# Patient Record
Sex: Male | Born: 1940 | Race: White | Hispanic: No | Marital: Married | Smoking: Former smoker
Health system: Southern US, Community
[De-identification: ages and names within clinical notes are randomized; demographics above are authoritative.]

## PROBLEM LIST (undated history)

## (undated) DIAGNOSIS — E119 Type 2 diabetes mellitus without complications: Secondary | ICD-10-CM

## (undated) DIAGNOSIS — H532 Diplopia: Secondary | ICD-10-CM

## (undated) DIAGNOSIS — C801 Malignant (primary) neoplasm, unspecified: Secondary | ICD-10-CM

## (undated) DIAGNOSIS — I714 Abdominal aortic aneurysm, without rupture, unspecified: Secondary | ICD-10-CM

## (undated) DIAGNOSIS — K4090 Unilateral inguinal hernia, without obstruction or gangrene, not specified as recurrent: Secondary | ICD-10-CM

## (undated) DIAGNOSIS — H9190 Unspecified hearing loss, unspecified ear: Secondary | ICD-10-CM

## (undated) DIAGNOSIS — Z9289 Personal history of other medical treatment: Secondary | ICD-10-CM

## (undated) DIAGNOSIS — I10 Essential (primary) hypertension: Secondary | ICD-10-CM

## (undated) DIAGNOSIS — E039 Hypothyroidism, unspecified: Secondary | ICD-10-CM

## (undated) DIAGNOSIS — E785 Hyperlipidemia, unspecified: Secondary | ICD-10-CM

## (undated) DIAGNOSIS — M1990 Unspecified osteoarthritis, unspecified site: Secondary | ICD-10-CM

## (undated) DIAGNOSIS — Z9889 Other specified postprocedural states: Secondary | ICD-10-CM

## (undated) HISTORY — DX: Essential (primary) hypertension: I10

## (undated) HISTORY — DX: Unspecified osteoarthritis, unspecified site: M19.90

## (undated) HISTORY — DX: Abdominal aortic aneurysm, without rupture: I71.4

## (undated) HISTORY — DX: Hyperlipidemia, unspecified: E78.5

## (undated) HISTORY — PX: EYE SURGERY: SHX253

## (undated) HISTORY — DX: Type 2 diabetes mellitus without complications: E11.9

## (undated) HISTORY — DX: Personal history of other medical treatment: Z92.89

## (undated) HISTORY — DX: Other specified postprocedural states: Z98.890

## (undated) HISTORY — DX: Abdominal aortic aneurysm, without rupture, unspecified: I71.40

---
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
## 1958-09-11 DIAGNOSIS — Z9889 Other specified postprocedural states: Secondary | ICD-10-CM

## 1958-09-11 HISTORY — PX: HAND AMPUTATION  AT WRIST: SUR29

## 1958-09-11 HISTORY — DX: Other specified postprocedural states: Z98.890

## 1967-12-30 HISTORY — PX: OTHER SURGICAL HISTORY: SHX169

## 1967-12-30 HISTORY — PX: APPENDECTOMY: SHX54

## 1995-01-29 HISTORY — PX: OTHER SURGICAL HISTORY: SHX169

## 2002-12-29 HISTORY — PX: BACK SURGERY: SHX140

## 2003-05-24 ENCOUNTER — Encounter: Admission: RE | Admit: 2003-05-24 | Discharge: 2003-05-24 | Payer: Self-pay | Admitting: Neurosurgery

## 2003-05-24 ENCOUNTER — Encounter: Payer: Self-pay | Admitting: Neurosurgery

## 2003-05-30 HISTORY — PX: NECK SURGERY: SHX720

## 2003-06-16 ENCOUNTER — Encounter: Payer: Self-pay | Admitting: Neurosurgery

## 2003-06-20 ENCOUNTER — Inpatient Hospital Stay (HOSPITAL_COMMUNITY): Admission: RE | Admit: 2003-06-20 | Discharge: 2003-06-21 | Payer: Self-pay | Admitting: Neurosurgery

## 2003-06-20 ENCOUNTER — Encounter: Payer: Self-pay | Admitting: Neurosurgery

## 2003-07-15 ENCOUNTER — Encounter: Payer: Self-pay | Admitting: Family Medicine

## 2003-07-15 ENCOUNTER — Encounter: Admission: RE | Admit: 2003-07-15 | Discharge: 2003-07-15 | Payer: Self-pay | Admitting: Family Medicine

## 2003-08-08 ENCOUNTER — Encounter: Payer: Self-pay | Admitting: Neurosurgery

## 2003-08-08 ENCOUNTER — Encounter: Payer: Self-pay | Admitting: Diagnostic Radiology

## 2003-08-08 ENCOUNTER — Encounter: Admission: RE | Admit: 2003-08-08 | Discharge: 2003-08-08 | Payer: Self-pay | Admitting: Neurosurgery

## 2003-08-23 ENCOUNTER — Encounter: Admission: RE | Admit: 2003-08-23 | Discharge: 2003-08-23 | Payer: Self-pay | Admitting: Neurosurgery

## 2003-08-23 ENCOUNTER — Encounter: Payer: Self-pay | Admitting: Neurosurgery

## 2003-09-08 ENCOUNTER — Encounter: Payer: Self-pay | Admitting: Neurosurgery

## 2003-09-08 ENCOUNTER — Encounter: Admission: RE | Admit: 2003-09-08 | Discharge: 2003-09-08 | Payer: Self-pay | Admitting: Neurosurgery

## 2003-10-17 ENCOUNTER — Encounter: Payer: Self-pay | Admitting: Neurosurgery

## 2003-10-17 ENCOUNTER — Inpatient Hospital Stay (HOSPITAL_COMMUNITY): Admission: RE | Admit: 2003-10-17 | Discharge: 2003-10-20 | Payer: Self-pay | Admitting: Neurosurgery

## 2003-11-16 ENCOUNTER — Ambulatory Visit (HOSPITAL_COMMUNITY): Admission: RE | Admit: 2003-11-16 | Discharge: 2003-11-16 | Payer: Self-pay | Admitting: Neurosurgery

## 2003-11-24 ENCOUNTER — Inpatient Hospital Stay (HOSPITAL_COMMUNITY): Admission: EM | Admit: 2003-11-24 | Discharge: 2003-12-01 | Payer: Self-pay | Admitting: *Deleted

## 2003-11-27 ENCOUNTER — Encounter (INDEPENDENT_AMBULATORY_CARE_PROVIDER_SITE_OTHER): Payer: Self-pay | Admitting: Cardiology

## 2003-11-28 ENCOUNTER — Encounter (INDEPENDENT_AMBULATORY_CARE_PROVIDER_SITE_OTHER): Payer: Self-pay | Admitting: Specialist

## 2004-06-11 ENCOUNTER — Ambulatory Visit (HOSPITAL_COMMUNITY): Admission: RE | Admit: 2004-06-11 | Discharge: 2004-06-11 | Payer: Self-pay | Admitting: Neurosurgery

## 2004-10-15 IMAGING — CT CT CHEST W/ CM
1 series · 16 of 31 positions shown, 20 images · IV contrast ([ID] omni 300)
Comparison: none

CLINICAL DATA: Pain, weakness, swelling.  
CT CHEST WITH CONTRAST, 11/29/03
TECHNIQUE: Multidetector helical CT scanning obtained through the chest following administration of 100 cc intravenous Omnipaque 300.  No immediate adverse reaction.

[Series 2: chest w/ · axial · 0.74mm/px · z∈[-310,-30]mm · 16 of 62 slices shown, 20 images]
[im 3/62  mediastinal]
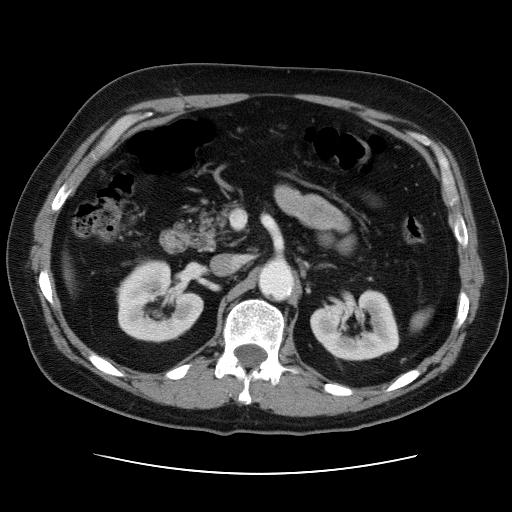
[im 3/62  lung]
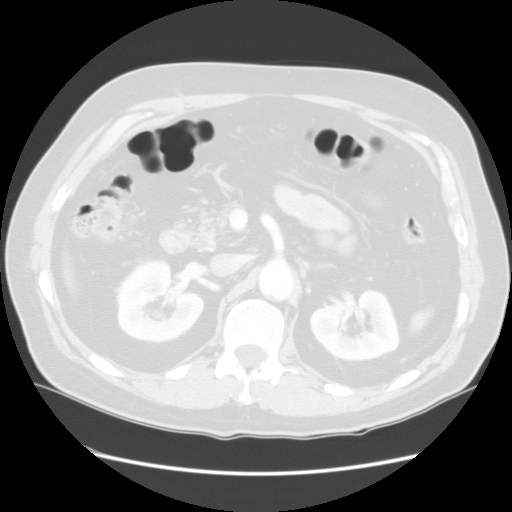
[im 7/62  lung]
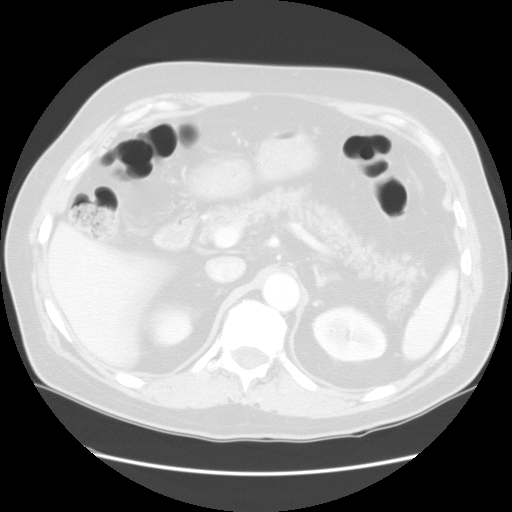
[im 12/62  lung]
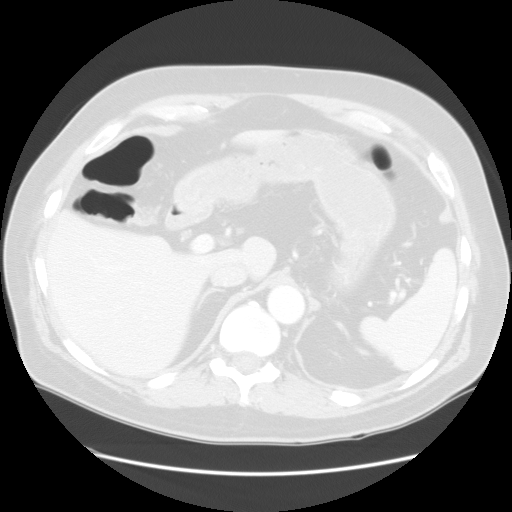
[im 14/62  lung]
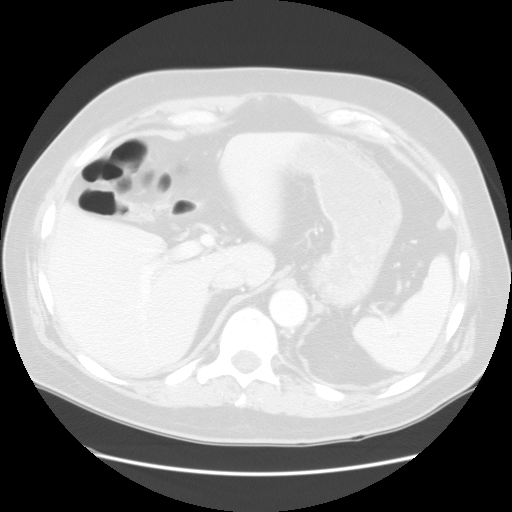
[im 19/62  mediastinal]
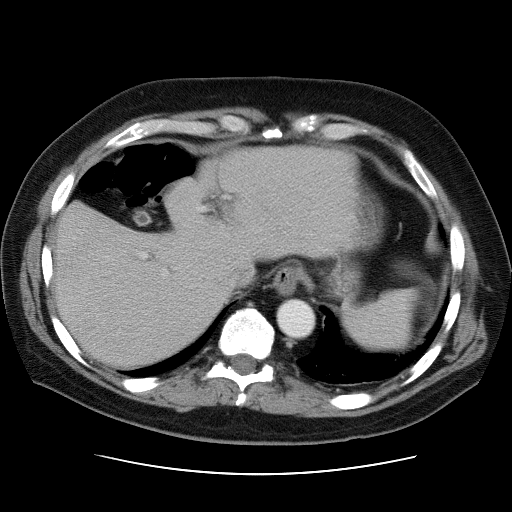
[im 19/62  lung]
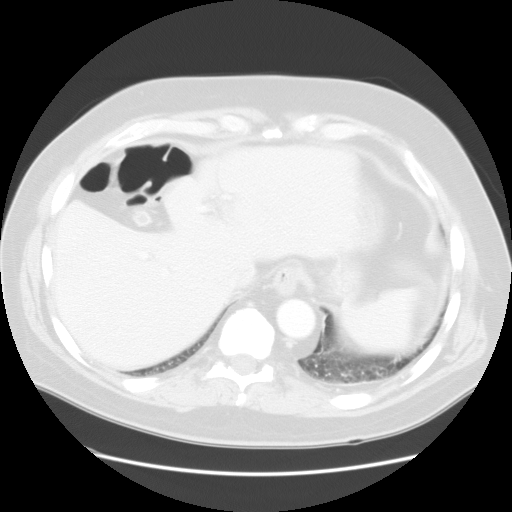
[im 21/62  lung]
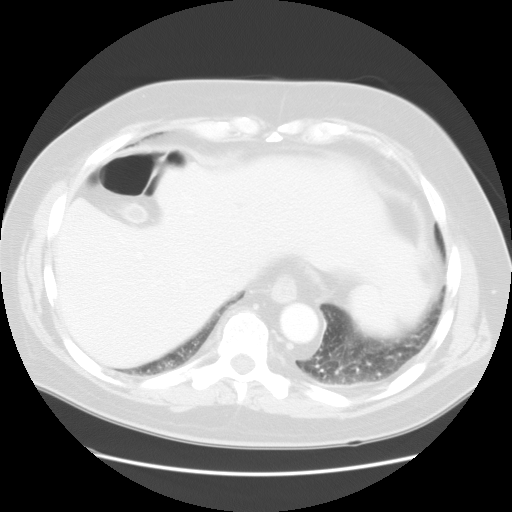
[im 25/62  lung]
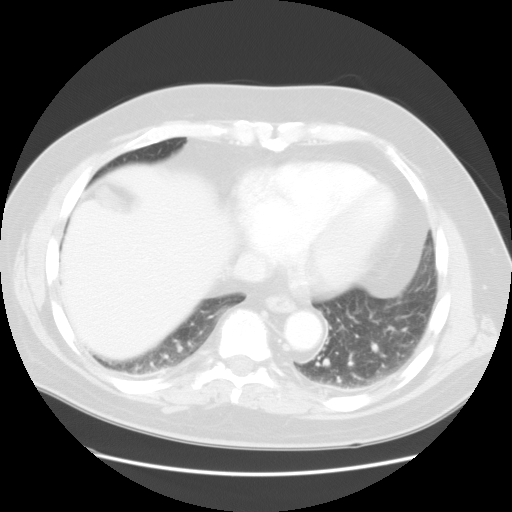
[im 30/62  lung]
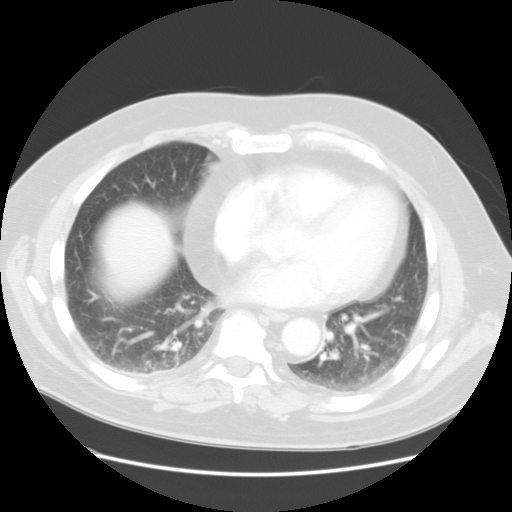
[im 33/62  mediastinal]
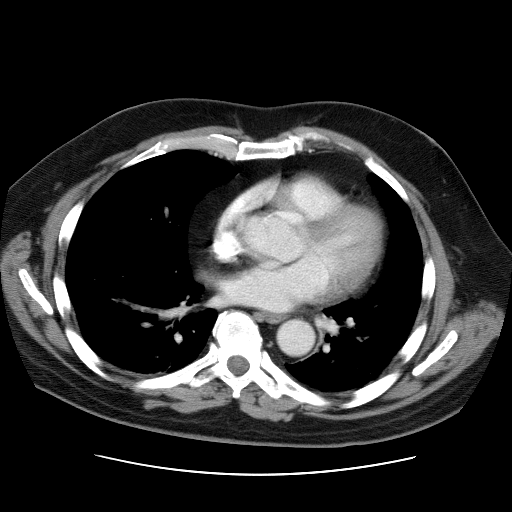
[im 33/62  lung]
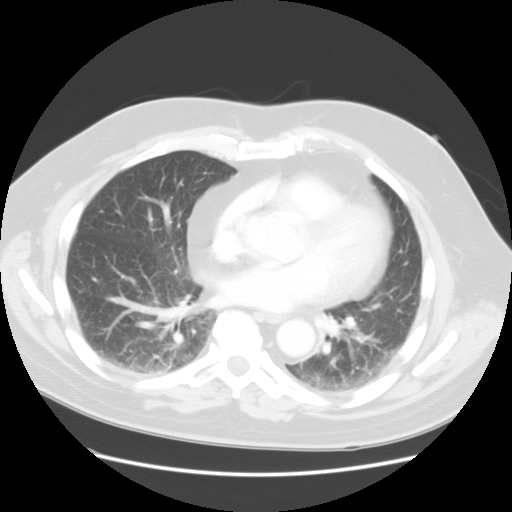
[im 37/62  lung]
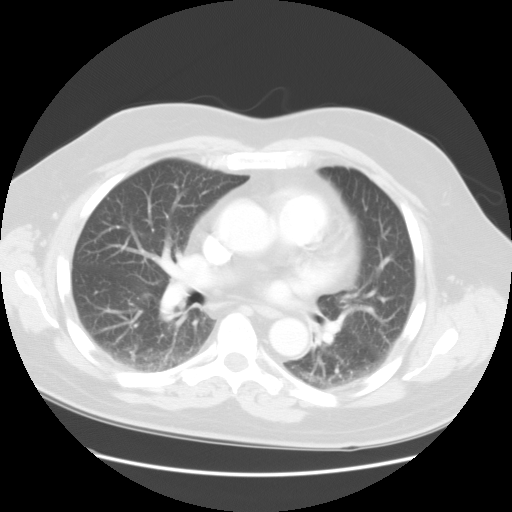
[im 41/62  lung]
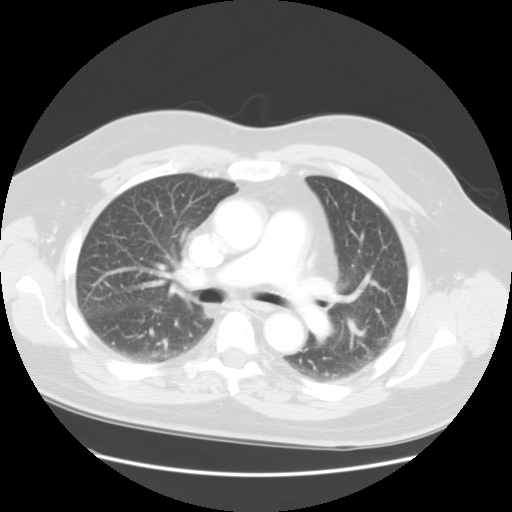
[im 43/62  lung]
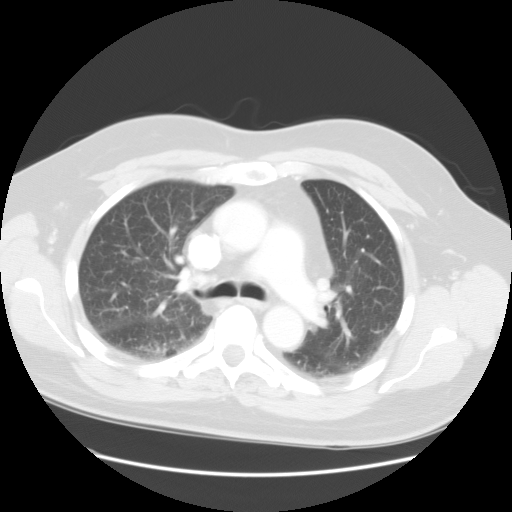
[im 48/62  mediastinal]
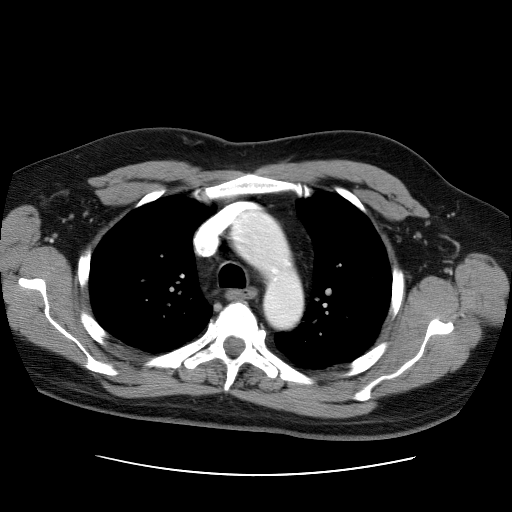
[im 48/62  lung]
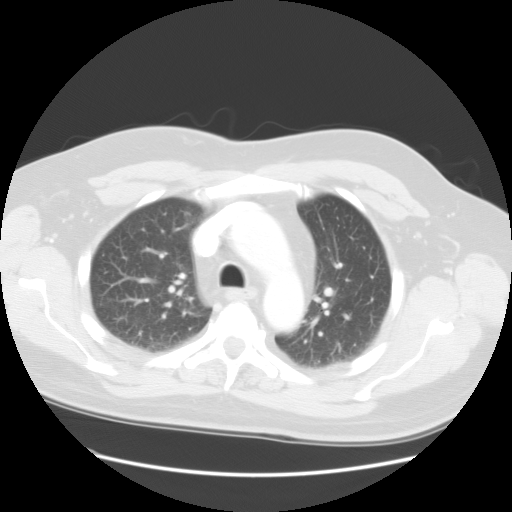
[im 50/62  lung]
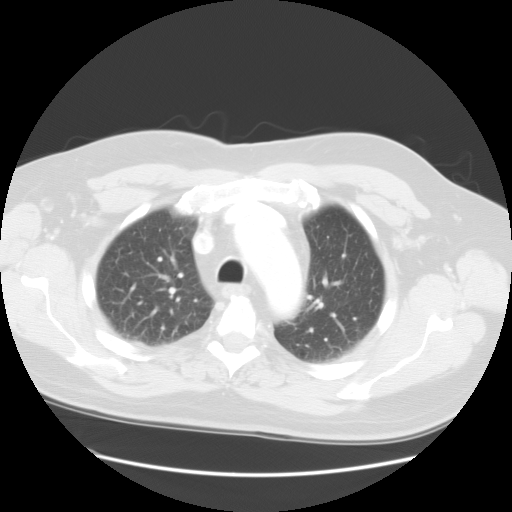
[im 55/62  lung]
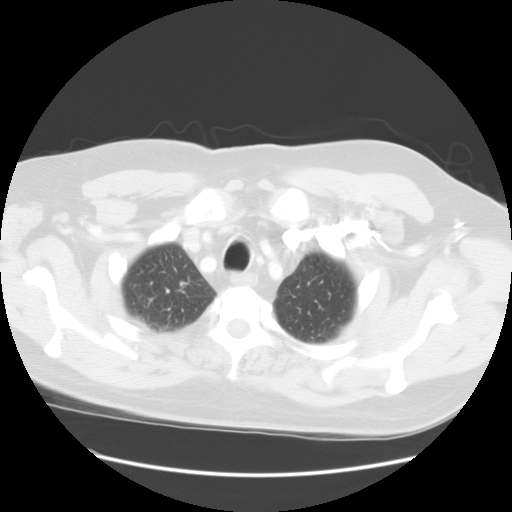
[im 59/62  lung]
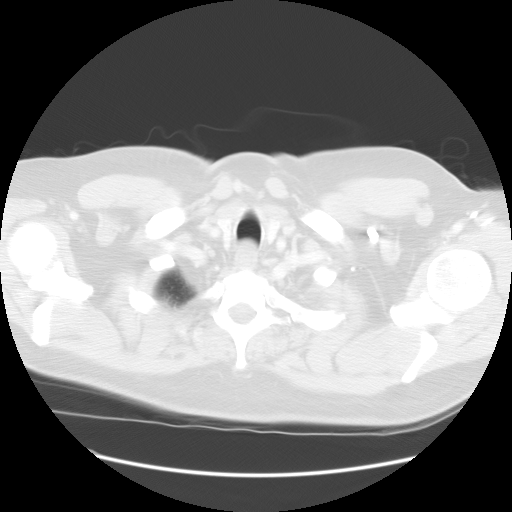

[16 of 31 positions shown; findings below may reference images not displayed]

FINDINGS: Upper limits of normal heart size.  oderate pericardial fat is present.  Remainder of the heart and great vessels are unremarkable.  No evidence of aortic dissection or aneurysm.  No enlarged lymph nodes identified.  No pleural or pericardial effusions.  Mild dependent and basilar atelectasis is noted.  Remainder of the lungs are clear.  Visualized upper abdomen is unremarkable except for a contracted gallbladder.  
IMPRESSION
Upper limits of normal heart size with moderate pericardial fat.

## 2005-11-26 ENCOUNTER — Encounter: Admission: RE | Admit: 2005-11-26 | Discharge: 2005-11-26 | Payer: Self-pay | Admitting: Neurosurgery

## 2007-09-22 ENCOUNTER — Encounter: Admission: RE | Admit: 2007-09-22 | Discharge: 2007-09-22 | Payer: Self-pay | Admitting: Neurosurgery

## 2010-05-21 ENCOUNTER — Encounter: Payer: Self-pay | Admitting: Internal Medicine

## 2010-06-03 ENCOUNTER — Encounter: Payer: Self-pay | Admitting: Internal Medicine

## 2010-06-19 ENCOUNTER — Ambulatory Visit: Payer: Self-pay | Admitting: Internal Medicine

## 2010-06-19 DIAGNOSIS — R0989 Other specified symptoms and signs involving the circulatory and respiratory systems: Secondary | ICD-10-CM

## 2010-06-19 DIAGNOSIS — R079 Chest pain, unspecified: Secondary | ICD-10-CM

## 2010-06-19 DIAGNOSIS — I82409 Acute embolism and thrombosis of unspecified deep veins of unspecified lower extremity: Secondary | ICD-10-CM | POA: Insufficient documentation

## 2010-06-19 DIAGNOSIS — E785 Hyperlipidemia, unspecified: Secondary | ICD-10-CM | POA: Insufficient documentation

## 2010-06-19 DIAGNOSIS — R0609 Other forms of dyspnea: Secondary | ICD-10-CM | POA: Insufficient documentation

## 2010-06-19 DIAGNOSIS — I10 Essential (primary) hypertension: Secondary | ICD-10-CM

## 2010-06-19 DIAGNOSIS — E119 Type 2 diabetes mellitus without complications: Secondary | ICD-10-CM | POA: Insufficient documentation

## 2010-07-09 ENCOUNTER — Ambulatory Visit: Payer: Self-pay | Admitting: Internal Medicine

## 2010-08-20 ENCOUNTER — Ambulatory Visit: Payer: Self-pay | Admitting: Internal Medicine

## 2011-01-28 NOTE — Miscellaneous (Signed)
Summary: Orders Update pft charges  Clinical Lists Changes  Orders: Added new Service order of Carbon Monoxide diffusing w/capacity (94720) - Signed Added new Service order of Lung Volumes (94240) - Signed Added new Service order of Spirometry (Pre & Post) (94060) - Signed 

## 2011-01-28 NOTE — Assessment & Plan Note (Signed)
Summary: Pulmonary/ new pt Dillon Wagner    Visit Type:  Initial Consult Copy to:  Dr. Phillips Hay Primary Provider/Referring Provider:  Dr. Elizebeth Koller  CC:  Dyspnea and CP.  History of Present Illness: 70 yowm quit smoking 1987 with no resp complaints  June 19, 2010 1st pulmonary ov with new onset chest heavy March 2011 hurt to take a deep breath occ cough rx with abx by primary and presently waxes and wanes same location everytime but better in that no longer hurts to take a deep breath with neg cardiac w/u, assoc with progressive hoarseness and  sob with exertion x across a parking lot.  CP just as likely to occur sitting as walking and also wakes up with it but not prematurely. No overt HB.    Pt denies any significant sore throat, dysphagia, itching, sneezing,  nasal congestion or excess secretions,  fever, chills, sweats, unintended wt loss, pleuritic or exertional cp, hempoptysis, change in activity tolerance  orthopnea pnd or leg swelling   Current Medications (verified): 1)  Gabapentin 600 Mg Tabs (Gabapentin) .Marland Kitchen.. 1 Two Times A Day 2)  Metformin Hcl 500 Mg Tabs (Metformin Hcl) .... 2 Two Times A Day 3)  Levothyroxine Sodium 75 Mcg Tabs (Levothyroxine Sodium) .Marland Kitchen.. 1 Once Daily 4)  Plavix 75 Mg Tabs (Clopidogrel Bisulfate) .Marland Kitchen.. 1 Once Daily 5)  Glipizide 5 Mg Tabs (Glipizide) .Marland Kitchen.. 1 Two Times A Day 6)  Flomax 0.4 Mg Caps (Tamsulosin Hcl) .Marland Kitchen.. 1 Once Daily 7)  Bystolic 10 Mg Tabs (Nebivolol Hcl) .Marland Kitchen.. 1 Once Daily 8)  Diltiazem Hcl Cr 240 Mg Xr24h-Cap (Diltiazem Hcl) .Marland Kitchen.. 1 Once Daily 9)  Simvastatin 40 Mg Tabs (Simvastatin) .... 1/2 At Bedtime  Allergies (verified): 1)  ! Codeine  Past History:  Past Medical History: CP    - Neg cardiac w/u 04/2010 at [facility] ?   - Trial of PPI June 20, 2010 >>>  Past Surgical History: Back surgery x 2  Appendectomy 1969 Rotator cuff repair (left)- 2007  Family History: Ovarian CA- Mother Stomach CA- Father  Social History: Married  with children Former smoker. Quit in 1987.  Smoked approx 38yrs up to 2 ppd No ETOH Retired  Review of Systems       The patient complains of shortness of breath with activity, chest pain, and irregular heartbeats.  The patient denies shortness of breath at rest, productive cough, non-productive cough, coughing up blood, acid heartburn, indigestion, loss of appetite, weight change, abdominal pain, difficulty swallowing, sore throat, tooth/dental problems, headaches, nasal congestion/difficulty breathing through nose, sneezing, itching, ear ache, anxiety, depression, hand/feet swelling, joint stiffness or pain, rash, change in color of mucus, and fever.    Vital Signs:  Patient profile:   70 year old male Height:      71 inches Weight:      194.31 pounds BMI:     27.20 O2 Sat:      97 % on Room air Temp:     98.8 degrees F oral Pulse rate:   57 / minute BP sitting:   110 / 68  (left arm)  Vitals Entered By: Vernie Murders (June 19, 2010 3:05 PM)  O2 Flow:  Room air  Physical Exam  Additional Exam:  amb wm with classic voice fatigue and pseudowheeze resolves with purse lip maneuver HEENT: nl dentition, turbinates, and orophanx. Nl external ear canals without cough reflex NECK :  without JVD/Nodes/TM/ nl carotid upstrokes bilaterally LUNGS: no acc muscle use, clear to  A and P bilaterally without cough on insp or exp maneuvers CV:  RRR  no s3 or murmur or increase in P2, no edema  ABD:  soft and nontender with nl excursion in the supine position. No bruits or organomegaly, bowel sounds nl MS:  warm without calf tenderness, cyanosis or clubbing. right arm partial amputation SKIN: warm and dry without lesions   NEURO:  alert, approp, no deficits     CT of Chest  Procedure date:  04/09/2010  Findings:      With contrast: small calcified punctate rml granulomas, o/w nl  Impression & Recommendations:  Problem # 1:  DYSPNEA (ICD-786.09)  When respiratory symptoms begin well  after a patient reports complete smoking cessation,  it is very hard to "blame" COPD  ie it doesn't make any more sense than hearing a  NASCAR driver wrecked his car while driving his kids to school or a Careers adviser sliced his hand off carving [place].  Once the high risk activity stops,  the symptoms should not suddenly erupt.  If so, the differential diagnosis should include  obesity/deconditioning,  LPR/Reflux, CHF, or side effect of medications   Strongly suspect gerd/ vcd as unifying dx also explaining atypical cp  Orders: New Patient Level V (16109)  Problem # 2:  CHEST PAIN (ICD-786.50) Neg cardiac w/u and ct chest so most likely unifying dx is gerd  Orders: New Patient Level V (60454)  Medications Added to Medication List This Visit: 1)  Gabapentin 600 Mg Tabs (Gabapentin) .Marland Kitchen.. 1 two times a day 2)  Metformin Hcl 500 Mg Tabs (Metformin hcl) .... 2 two times a day 3)  Levothyroxine Sodium 75 Mcg Tabs (Levothyroxine sodium) .Marland Kitchen.. 1 once daily 4)  Plavix 75 Mg Tabs (Clopidogrel bisulfate) .Marland Kitchen.. 1 once daily 5)  Glipizide 5 Mg Tabs (Glipizide) .Marland Kitchen.. 1 two times a day 6)  Flomax 0.4 Mg Caps (Tamsulosin hcl) .Marland Kitchen.. 1 once daily 7)  Bystolic 10 Mg Tabs (Nebivolol hcl) .Marland Kitchen.. 1 once daily 8)  Diltiazem Hcl Cr 240 Mg Xr24h-cap (Diltiazem hcl) .Marland Kitchen.. 1 once daily 9)  Simvastatin 40 Mg Tabs (Simvastatin) .... 1/2 at bedtime  Patient Instructions: 1)  Acid reflux is a  leading suspect here and needs to be eliminated  completely before considering additional studies or treatment options. To suppress this maximally, take prilosec 20mg  30 min before first and last meal and pepcid 20 mg (otc) at bedtime plus diet measures as listed 2)  Please schedule a follow-up appointment in 2 weeks, sooner if needed  3)  Have Dr Clarene Duke fax me the results of your physical and heart work up to [phone]  4)  GERD (REFLUX)  is a common cause of respiratory symptoms. It commonly presents without heartburn and can be treated  with medication, but also with lifestyle changes including avoidance of late meals, excessive alcohol, smoking cessation, and avoid fatty foods, chocolate, peppermint, colas, red wine, and acidic juices such as orange juice. NO MINT OR MENTHOL PRODUCTS SO NO COUGH DROPS  5)  USE SUGARLESS CANDY INSTEAD (jolley ranchers)  6)  NO OIL BASED VITAMINS

## 2011-01-28 NOTE — Assessment & Plan Note (Signed)
Summary: Pulmonary/ f/u ov with walking sats = nl    Copy to:  Dr. Phillips Hay Primary Provider/Referring Provider:  Dr. Elizebeth Koller  CC:  2 wk followup.  Pt states that he had episode twice last wk where he felt pressure in his chest and felt like he could not catch his breath.  He states that overall his breathing has improved since last seen.  Dillon Wagner  History of Present Illness: 70 yowm quit smoking 1987 with no resp complaints  June 19, 2010 1st pulmonary ov with new onset chest heavy March 2011 hurt to take a deep breath occ cough rx with abx by primary and presently waxes and wanes same location everytime but better in that no longer hurts to take a deep breath with neg cardiac w/u, assoc with progressive hoarseness and  sob with exertion x across a parking lot.  CP just as likely to occur sitting as walking and also wakes up with it but not prematurely. No overt HB.    July 09, 2010 2 wk followup.  Pt states that he had episode twice last wk where he felt pressure in his chest and felt like he could not catch his breath.  He states that overall his breathing has improved since last seen.  Pt denies any significant sore throat, dysphagia, itching, sneezing,  nasal congestion or excess secretions,  fever, chills, sweats, unintended wt loss, classic  pleuritic or exertional cp, hempoptysis, change in activity tolerance  orthopnea pnd or leg swelling. Pt also denies any obvious fluctuation in symptoms with weather or environmental change or other alleviating or aggravating factors.        Current Medications (verified): 1)  Gabapentin 600 Mg Tabs (Gabapentin) .Dillon Wagner.. 1 Two Times A Day 2)  Metformin Hcl 500 Mg Tabs (Metformin Hcl) .... 2 Two Times A Day 3)  Levothyroxine Sodium 75 Mcg Tabs (Levothyroxine Sodium) .Dillon Wagner.. 1 Once Daily 4)  Plavix 75 Mg Tabs (Clopidogrel Bisulfate) .Dillon Wagner.. 1 Once Daily 5)  Glipizide 5 Mg Tabs (Glipizide) .Dillon Wagner.. 1 Two Times A Day 6)  Flomax 0.4 Mg Caps (Tamsulosin Hcl) .Dillon Wagner..  1 Once Daily 7)  Bystolic 10 Mg Tabs (Nebivolol Hcl) .Dillon Wagner.. 1 Once Daily 8)  Diltiazem Hcl Cr 240 Mg Xr24h-Cap (Diltiazem Hcl) .Dillon Wagner.. 1 Once Daily 9)  Simvastatin 40 Mg Tabs (Simvastatin) .... 1/2 At Bedtime 10)  Prilosec 20 Mg Cpdr (Omeprazole) .Dillon Wagner.. 1 30 Min Before First and Last Meals 11)  Pepcid 20 Mg Tabs (Famotidine) .Dillon Wagner.. 1 At Bedtime  Allergies (verified): 1)  ! Codeine  Past History:  Past Medical History: CP    - Neg cardiac w/u  06/03/10 nl ef, no ischemia on myoview   - Trial of PPI June 20, 2010 >>> unproved  Vital Signs:  Patient profile:   70 year old male Weight:      196.50 pounds O2 Sat:      95 % on Room air Temp:     97.8 degrees F oral Pulse rate:   88 / minute BP sitting:   106 / 60  (left arm)  Vitals Entered By: Vernie Murders (July 09, 2010 10:59 AM)  O2 Flow:  Room air  Physical Exam  Additional Exam:  amb wm with classic voice fatigue improved wt  194 > 196 July 09, 2010  HEENT: nl dentition, turbinates, and orophanx. Nl external ear canals without cough reflex NECK :  without JVD/Nodes/TM/ nl carotid upstrokes bilaterally LUNGS: no acc muscle use, clear to A  and P bilaterally without cough on insp or exp maneuvers CV:  RRR  no s3 or murmur or increase in P2, no edema  ABD:  soft and nontender with nl excursion in the supine position. No bruits or organomegaly, bowel sounds nl MS:  warm without calf tenderness, cyanosis or clubbing. right arm partial amputation    Ambulatory Pulse Oximetry  Resting; HR__64___    02 Sat_98____  Lap1 (185 feet)   HR__76___   02 Sat__98__ Lap2 (185 feet)   HR__77___   02 Sat___97__    Lap3 (185 feet)   HR____87_   02 Sat_97____  _x_Test Completed without Difficulty ___Test Stopped due to: .Kandice Hams CMA  July 09, 2010 11:20 AM    Impression & Recommendations:  Problem # 1:  DYSPNEA (ICD-786.09)  Strongly suspect gerd/ vcd as unifying dx also explaining atypical cp  Orders: Est. Patient Level III  (99213) Pulse Oximetry, Ambulatory (16109)  Problem # 2:  HYPERTENSION (ICD-401.9)  His updated medication list for this problem includes:    Bystolic 10 Mg Tabs (Nebivolol hcl) .Dillon Wagner... 1 once daily    Diltiazem Hcl Cr 240 Mg Xr24h-cap (Diltiazem hcl) .Dillon Wagner... 1 once daily  Orders: Est. Patient Level III (60454)  Medications Added to Medication List This Visit: 1)  Prilosec 20 Mg Cpdr (Omeprazole) .Dillon Wagner.. 1 30 min before first and last meals 2)  Pepcid 20 Mg Tabs (Famotidine) .Dillon Wagner.. 1 at bedtime  Patient Instructions: 1)  Please schedule a follow-up appointment in 6 weeks, sooner if needed with PFT's on return

## 2011-01-28 NOTE — Assessment & Plan Note (Signed)
Summary: Pulmonary/ summary f/u ov   Copy to:  Dr. Phillips Hay Primary Provider/Referring Provider:  Dr. Elizebeth Koller  CC:  Followup with PFT's.   - Feeling good  - Occas SOB with increased humidity - Occas diff getting a deep breath.  History of Present Illness: 70 yowm quit smoking 1987 with no resp complaints  June 19, 2010 1st pulmonary ov with new onset chest heavy March 2011 hurt to take a deep breath occ cough rx with abx by primary and presently waxes and wanes same location everytime but better in that no longer hurts to take a deep breath with neg cardiac w/u, assoc with progressive hoarseness and  sob with exertion x across a parking lot.  CP just as likely to occur sitting as walking and also wakes up with it but not prematurely. No overt HB.  rec ppi and hs h2.  July 09, 2010 2 wk followup.  Pt states that he had episode twice last wk where he felt pressure in his chest and felt like he could not catch his breath.  He states that overall his breathing has improved since last seen.  rec no change rx  August 20, 2010 Followup with PFT's.   - Feeling good  - Occas SOB with increased humidity - Occas diff getting a deep breath. no more spells as long as avoid the real hot weather or high ozone days. Pt denies any significant sore throat, dysphagia, itching, sneezing,  nasal congestion or excess secretions,  fever, chills, sweats, unintended wt loss, pleuritic or exertional cp, hempoptysis, change in activity tolerance  orthopnea pnd or leg swelling/  still very hoarse.  Current Medications (verified): 1)  Gabapentin 600 Mg Tabs (Gabapentin) .Marland Kitchen.. 1 Two Times A Day 2)  Metformin Hcl 500 Mg Tabs (Metformin Hcl) .... Take One Po  Two Times A Day 3)  Levothyroxine Sodium 75 Mcg Tabs (Levothyroxine Sodium) .Marland Kitchen.. 1 Once Daily 4)  Plavix 75 Mg Tabs (Clopidogrel Bisulfate) .Marland Kitchen.. 1 Once Daily 5)  Glipizide 5 Mg Tabs (Glipizide) .Marland Kitchen.. 1 Two Times A Day 6)  Flomax 0.4 Mg Caps (Tamsulosin Hcl)  .Marland Kitchen.. 1 Once Daily 7)  Bystolic 10 Mg Tabs (Nebivolol Hcl) .Marland Kitchen.. 1 Once Daily 8)  Diltiazem Hcl Cr 240 Mg Xr24h-Cap (Diltiazem Hcl) .Marland Kitchen.. 1 Once Daily 9)  Simvastatin 40 Mg Tabs (Simvastatin) .... 1/2 At Bedtime 10)  Prilosec 20 Mg Cpdr (Omeprazole) .Marland Kitchen.. 1 30 Min Before First and Last Meals 11)  Pepcid 20 Mg Tabs (Famotidine) .Marland Kitchen.. 1 At Bedtime  Allergies (verified): 1)  ! Codeine  Past History:  Past Medical History: CP    - Neg cardiac w/u  06/03/10 nl ef, no ischemia on myoview   - Trial of PPI June 20, 2010 >>>  improved Dyspnea   - PFT's August 20, 2010 FEV1 2.53 (87%) ratio 76 and nl DLC0 with classic insp truncation c/w vcd  Hiatal hernia (pt's hx)  Vital Signs:  Patient profile:   70 year old male Height:      70 inches Weight:      199 pounds BMI:     28.66 O2 Sat:      97 % on Room air Temp:     97.6 degrees F oral Pulse rate:   70 / minute BP sitting:   110 / 58  (left arm) Cuff size:   regular  Vitals Entered By: Abigail Miyamoto RN (August 20, 2010 11:15 AM)  O2 Flow:  Room air  Physical  Exam  Additional Exam:  amb wm with classic voice fatigue improved but still quite hoarse wt  196 July 09, 2010 > 199 August 20, 2010  HEENT: nl dentition, turbinates, and orophanx. Nl external ear canals without cough reflex NECK :  without JVD/Nodes/TM/ nl carotid upstrokes bilaterally LUNGS: no acc muscle use, clear to A and P bilaterally without cough on insp or exp maneuvers CV:  RRR  no s3 or murmur or increase in P2, no edema  ABD:  soft and nontender with nl excursion in the supine position. No bruits or organomegaly, bowel sounds nl MS:  warm without calf tenderness, cyanosis or clubbing. right arm partial amputation      Impression & Recommendations:  Problem # 1:  DYSPNEA (ICD-786.09)  Strongly suspect gerd/ vcd as unifying dx also explaining atypical cp sob and hoarseness and strongly suggested by truncation of f/v loop on today's pft's so next step is ENT eval  and consider GI eval if find w/d of acid suppresion results in worse symptoms since he's not yet convinced he's really better.  NB the  ramp to expected improvement (and for that matter, worsening, if a chronic effective medication is stopped)  can be measured in weeks, not days, a common misconception because this is not Heartburn with no immediate cause and effect relationship so that response to therapy or lack thereof can be very difficult to assess.   Problem # 2:  HYPERTENSION (ICD-401.9)  His updated medication list for this problem includes:    Bystolic 10 Mg Tabs (Nebivolol hcl) .Marland Kitchen... 1 once daily    Diltiazem Hcl Cr 240 Mg Xr24h-cap (Diltiazem hcl) .Marland Kitchen... 1 once daily   In absence of asthma prob ok to use less selective Beta blocker if needed for insurance purposes but doing fine on present on rx   Medications Added to Medication List This Visit: 1)  Metformin Hcl 500 Mg Tabs (Metformin hcl) .... Take one po  two times a day  Other Orders: Est. Patient Level IV (30865)  Patient Instructions: 1)  ENT doctor needs to evaluate hoarseness 2)  after ENT evaluation ok to reduce you acid suppression 3)  if symptoms worsen  after you stop acid suppression,  I  recommend a GI evaluation

## 2011-05-16 NOTE — Consult Note (Signed)
NAME:  Dillon Wagner, Dillon Wagner NO.:  [id number]   MEDICAL RECORD NO.:  [id number]                   PATIENT TYPE:  INP   LOCATION:  3039                                 FACILITY:  [facility]   PHYSICIAN:  Genene Churn. Love, M.D.                 DATE OF BIRTH:  Feb 09, 1941   DATE OF CONSULTATION:  DATE OF DISCHARGE:                                   CONSULTATION   This 70 year old right-handed white, married, male is seen at the request of  Dr. Barnett Abu for evaluation of bilateral lower extremity distal greater  than proximal weakness.   HISTORY OF PRESENT ILLNESS:  Dillon Wagner has a history of shoulder, neck and  left arm pain for which he underwent anterior cervical diskectomy and fusion  at C5-6 and C6-7 with [employer] allograft bone and tether anterior cervical  plate by Dr. Phoebe Perch in June of 2004.  At that time he did well from surgery  but noted following surgery that back pain which he had in the past became  almost constant.  It would in the lower back region and radiate down the  right leg.  He noticed numbness across the top and the bottom of his right  foot.  He was seen by Dr. Phoebe Perch and noted to have a right footdrop.  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  Postoperatively the pain  improved in his back and down his right leg.  He noted some pain in his  lower back going into the right buttock.  He noticed no improvement in the  right foot and leg strength.  He noticed numbness and burning across the top  and bottom of his right foot.  He then noticed the onset of numbness across  the top of his left foot and he developed a left footdrop.  He has had no  associated bowel or bladder symptoms.  He has no history of Lhermitte's  sign.  His symptomatology seems to be worse in the lying position and better  in the standing position.  He was admitted to [facility] by Dr.  Danielle Dess on November 24, 2003, because of continued symptoms.  Studies in the  hospital have included a repeat MRI study of the lumbar spine and x-rays of  the lumbar spine which have shown no evidence of significant postoperative  abnormality, it shows the screws and fusion to be intact.  His white blood  cell count is 7,000, hemoglobin 14.2, hematocrit 41.8, platelets 321,000.  PTT, PT, comprehensive metabolic panel, hemoglobin A1c, PSA, B12 and  urinalysis are all normal with a slightly above the lower limits of normal  B12 and TSH is mildly elevated.  He complains primarily of foot and leg  weakness, right and left, some back pain radiating into the right buttock,  numbness along the top and bottom of his right foot and some numbness along  the top of his left foot.  He is using a brace when in the standing or  sitting position for his lower back.   PAST MEDICAL HISTORY:  Significant for:  1. 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  2. Hypertension for four years.  3. He is status post left elbow surgery for trauma in 1958.  4. He has had cervical disk and lumbar spine surgery.   PHYSICAL EXAMINATION:  A well-developed white male.  Blood pressure in left  arm 100/60, heart rate 104, no bruits.  NECK:  Supple.   MENTAL STATUS EXAM:  He is alert and oriented x3.   CRANIAL NERVE EXAMINATION:  Visual fields full, disks flat, extraocular  movements full, corneals present.  No seventh nerve palsy.  Tongue midline.  Uvula midline.  Gag is present.   MOTOR EXAMINATION:  Absent right hand.  He was 5/5 otherwise in the upper  extremities.  In his lower extremities he was 5/5 in the iliopsoas and  quadriceps femoris and the adductor magnus longus.  He was 4+/5 in the  gluteus maximus, gluteus medius 4-/5, and the hamstrings and gastrosoleus  with 1-2/5 in the EHL, anterior tibialis and peronei.  He was 3/5 in  the  posterior tibialis.  He had some altered sensation of his left foot along  the L5 dermatomal distribution.  He had intact knee jerks with crossed  adductors.  He had absent right ankle jerk and he had a left ankle jerk  which was present.  Plantar responses were downgoing.  He could stand and he  could walk.   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   PLAN:  1. EMG and nerve conduction study.  2. Consider postoperative Guillain-Barre as a potential cause for this     disorder.  3. Will try to localize this more clearly with EMG and nerve conduction     study before considering MR imaging at higher levels.                                               Genene Churn. Sandria Manly, M.D.    JML/MEDQ  D:  11/26/2003  T:  11/26/2003  Job:  [id number]

## 2011-05-16 NOTE — Consult Note (Signed)
NAME:  KAHLIL, COWANS NO.:  [id number]   MEDICAL RECORD NO.:  [id number]                   PATIENT TYPE:  INP   LOCATION:  3039                                 FACILITY:  [facility]   PHYSICIAN:  Lonia Blood, M.D.            DATE OF BIRTH:  03-25-1941   DATE OF CONSULTATION:  DATE OF DISCHARGE:                                   CONSULTATION   REFERRING PHYSICIAN:  Stefani Dama, M.D.   REASON FOR CONSULTATION:  Inpatient medical care.   HISTORY OF PRESENT ILLNESS:  Mr. Pimenta is a very pleasant 70 year old  gentleman with medical history as detailed below.  He was admitted to the  hospital today by Dr. Stefani Dama for evaluation of thigh and a lower  foot drop.  We are asked to assist in evaluation of this diagnosis as well  as provide assistance with care of multiple medical issues during this  hospitalization.   The patient was found resting in bed comfortably.  He reports that he has  had a right lower foot drop for many months if not a year or greater for  which he ultimately underwent an L5-S1 decompression surgery October 2004.  Since that time, he has done reasonably well but has begun to experience  generalized weakness in the lower extremities for the last week and a half  to two weeks.  Of note, the patient has experienced acute onset of foot drop  on the left foot approximately three days prior to admission.  With  progression of low back pain and increased weakness by the lower  extremities, he contacted the general physician this morning, but ultimately  referred him back to [facility] where he presented for  evaluation.   REVIEW OF SYMPTOMS:  Notably unremarkable.  The patient has had no headache  or recurrent ocular complaints.  He has no chest pain or shortness of  breath.  He is able to provide a full medical history.   PAST MEDICAL HISTORY:  1. Status post cervical decompression L5-S1 October 17, 2003.  2. Status post anterior cervical decompression C5-C6, C6-C7 in 2004.  3. 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  4. Cardiomyopathy, no other history is available.  The patient states he has     not seen a cardiologist.  No evidence of echocardiogram or cardiac     evaluation at [facility].  5. Known diaphragmatic hernia.  6. Hypertension.  7. Hyperlipidemia.   MEDICATIONS:  1. Cardizem CD 300 mg p.o. q.d.  2. Diovan 160 mg p.o. q.d.  3. Lasix 40 mg p.o. q.d.  4. Potassium chloride 30 mg p.o. q.d.  5. Celebrex 200 mg p.o. b.i.d.  6. Lipitor 20 mg p.o. b.i.d.  7. Percocet p.r.n.   ALLERGIES:  The patient reports an intolerance  to CODEINE and angioedema  with the use of ALEVE.   FAMILY HISTORY:  Noncontributory.   SOCIAL HISTORY:  He does not smoke.  He drinks a very little amount on  occasion.  He lives in [place].   LABORATORY DATA:  Electrolytes are reviewed and are normal.  LFTs are  normal.  Albumin is 3.5.  Hemoglobin is 14.2, MCV is 89, white count is 7.0,  platelets are 321,000.  Erythrocyte sedimentation rate is normal.  Coagulases are normal.  Urinalysis is unremarkable.   PHYSICAL EXAMINATION:  VITAL SIGNS:  Temperature 97.1, blood pressure  117/64, heart rate 87, respiratory rate 20.  O2 saturation 97% on room air.  GENERAL:  Well-developed, well-nourished male in no acute respiratory  distress.  HEENT:  Normocephalic and atraumatic.  Pupils are equal, round, and reactive  to light and accommodation.  Extraocular movements intact bilaterally.  OC/EC clear.  NECK:  No lymphadenopathy.  No thyromegaly.  No JVD.  LUNGS:  Clear to auscultation bilaterally without wheezes or rhonchi.  CARDIOVASCULAR:  Regular rate and rhythm without murmurs, rubs, or gallops.  Normal S1 and S2.  ABDOMEN:  Mildly obese, soft.  Bowel sounds present without  hepatosplenomegaly and no rebound.  EXTREMITIES:  There is 1+  bilateral lower extremity edema without erythema.  No acute changes or lesions of appreciable size or significance.  The  patient is status post traumatic amputation of his right hand but this is a  well-healed wound from multiple years ago.  NEUROLOGICAL:  Alert and oriented x 4.  Cranial nerves II through XII intact  bilaterally.  Intact sensation and touch throughout.  There is 0/5 strength  on dorsiflexion of bilateral feet but otherwise 5/5 strength throughout all  range of testing upper and lower extremities.   RECOMMENDATIONS:  1. Bilateral foot drop:  As noted above, the patient's right foot drop is     chronic and was likely related to decompression at the lumbar verterbrae-     5/sacral vertebrae-1 level. There has been no significant change in this     foot drop since his decompression in October and it is not likely at this     point that he will recover in my opinion.  Nonetheless, the left lower     extremity foot drop is new and the generalized weakness in bilateral     lower extremities is concerning.  This weakness is not significant at     this point that it can be appreciated by the examiner but it is     appreciable with the patient himself.  This certainly is concerning for     an epidural or spinal abscess as another concern is by the primary     service.  I agree with MRI with contrast of the lumbar and sacral spine     to rule this out.  If this in fact is unremarkable, I would consider EMG     and nerve conduction velocity studies to further delineate this problem.     I would also check a hemoglobin A1C and a fasting blood sugar to rule out     occult diabetes as a possible cause of neuropathy.  I will request     physical therapy evaluation and we will consider splint to keep the feet     in the dorsiflex position until this is further understood.  2. Cardiomyopathy:  The patient has evidence of cardiomyopathy on his chest    x-ray but  had no prior cardiac  evaluation per his recollection.  I will     take advantage of the fact that he will be an inpatient to obtain an     echocardiogram to quantify and qualify his cardiomyopathy.  It is quite     possible at this time that we could further titrate his medication to     improve his overall stamina in hopes to contribute to his potential for     rehabilitation.  If we are able to identify wall motion abnormality     suggestive of coronary artery disease, it would also be advisable that     risk stratification of coronary artery disease be pursued.  3. Hypertension:  The patient's blood pressure is currently well controlled     on the above-listed regimen.  I see no reason to change at this time.  We     will continue his management.   DISPOSITION:  Thank you so much for this consult on this very pleasant  gentleman.  We will be happy to follow along with you during his  hospitalization.                                               Lonia Blood, M.D.    JTM/MEDQ  D:  11/24/2003  T:  11/24/2003  Job:  [id number]

## 2011-05-16 NOTE — Op Note (Signed)
NAME:  Dillon Wagner, Dillon Wagner                       ACCOUNT NO.:  [id number]   MEDICAL RECORD NO.:  [id number]                   PATIENT TYPE:  INP   LOCATION:  2860                                 FACILITY:  [facility]   PHYSICIAN:  Clydene Fake, M.D.               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   PROCEDURE:  L5-S1 posterior lumbar interbody fusion with branding and  interbody cages, Monarch nonsegmented pedicle screw fixation. Posterolateral  fusion L5-S1 with autograft same incision, allograft Symphony system.   SURGEON:  Clydene Fake, M.D.   ASSISTANT:  Hewitt Shorts, M.D.   ANESTHESIA:  General endotracheal tube anesthesia.   ESTIMATED BLOOD LOSS:  150 mL.   BLOOD REPLACEMENT:  No blood given.   DRAINS:  None.   COMPLICATIONS:  None.   INDICATIONS FOR PROCEDURE:  The patient is a 70 year old gentleman  who has  been having chronic  low back pain which has been worsening with more severe  right leg pain radiating down the L5 root. He has been through epidural  injections which did help the pain but only for a short period of time. An  MRI was done showing severe degenerative disk disease, decreased disk height  at L5-S1 with biforaminal narrowing, broad based disk  bulging. The patient  was brought in for decompression and fusion.   DESCRIPTION OF PROCEDURE:  The patient was brought to the operating room and  general anesthesia was induced. The patient was placed on the prone position  on the Wilson frame with all pressure points padded. The patient was prepped  and draped in  a sterile fashion and the site of incision was injected with  10 mL of 0.5% Lidocaine with epinephrine.   An incision was made in the midline of the lower lumbar spine. The incision  was taken down to the fascia. Hemostasis was achieved with Bovie  cauterization. The fascia was incised over the L5 and S1 spinous processes  and subperiosteal dissection was performed to the spinous processes and  laminotomies to the facets. I placed a marker in the interspace and brought  in the fluoroscopy which confirmed our position  at the L5-S1.   We then increased the size of the incision cephalad so we could dissect over  the L4 spinous process of the lamina so we could get to the L4-5 facet. We  carefully dissected over the notch, disrupting it so we could get to the  transverse process of 5. Once we had the exposure, a laminectomy was done at  L5-S1, leaving  the superior aspect of L5 intact.   We went ahead and did a needle facetectomy and did generous foraminotomies  over the 5 root with drill and Kerrison punches. We also did a foraminotomy  over the S1 roots. When we were finished we had central decompression. The  S1 and L5 roots were decompressed bilaterally.   At this point a large disk bulge was seen. The disk space was incised and a  diskectomy was performed with pituitary rongeurs. We then prepared the  interspace for interbody cages by distracting the interspace up to 9 mm.  While holding distraction on 1 side and working on the other  side, we  scraped the disk out, removing disk with the endplates fascia scrapers and  then using the tunnel broach for the 9 x 9 mm threaded cage. We then used  the curets and cleaned out ______ endplates throughout the rest of the  vertebral body so that we could get good interbody over-bone growth.   All the bone that was removed during the laminectomy was cleaned and chopped  up into small  pieces and placed through the Symphony system to get rich   plasma on it and the bone was then packed into the 9 x 9 mm threaded cages.  We placed this bone into the interspace and then tapped the cage into place  with more bone on the other side and then tapped the cage into place.  Fluoroscopic images confirmed good position  of the cages and the bone in  the interbody space. We made sure there was no loose bone around the nerve  roots and irrigated with antibiotic solution.   Attention was then taken to the pedicle entry points. Using fluoroscopy and  intraoperatively,  we found the pedicle entry points for L5 on the left  side. We decorticated with a high-speed drill. We placed a pedicle probe  down the pedicle. We then tested it with a ball probe. We tapped the pedicle  and actually began with the ball probe and placed a 45 x 6 mm Monarch screw.  This process was repeated at the left side of S1 and we placed a 40-mm long  screw.   We then placed screws on the right side at L5 and S1 using the same size  screws. We decorticated the transverse processes, lateral facets and lateral  sacrum. We then packed the rest of the autograft bone and the allograft bone  that was placed through the Symphony system into the posterolateral gutters  for an L5-S1 fusion bilaterally. We placed rods  on the top of the screw  heads, tightened one down, compressed over the system and then tightened the  2nd one down. We did this first on the right and then the left. When we were  finished final AP and lateral fluoroscopic images were obtained showing good  position of the pedicle screws, rods and interbody cages.   The wound was irrigated with antibiotic solution. Hemostasis was achieved  with Bovie cauterization. The retractor was removed and the paraspinous  muscles were closed with 0 Vicryl interrupted suture. The fascia was closed  with 0 Vicryl interrupted suture. The subcutaneous tissue was closed with 0, 2-0 and 3-0 Vicryl interrupted suture  and the skin  was closed with Benzoin  and Steri-Strips. A dressing was placed.   The patient was placed back to a supine position. He was awakened from  anesthesia and transferred to the recovery room in stable condition.  Clydene Fake, M.D.    JRH/MEDQ  D:  10/17/2003  T:  10/17/2003  Job:  [id number]

## 2011-05-16 NOTE — H&P (Signed)
NAME:  Dillon Wagner NO.:  [id number]   MEDICAL RECORD NO.:  [id number]                   PATIENT TYPE:  EMS   LOCATION:  MAJO                                 FACILITY:  [facility]   PHYSICIAN:  Stefani Dama, M.D.               DATE OF BIRTH:  [birth year]   DATE OF ADMISSION:  11/24/2003  DATE OF DISCHARGE:                                HISTORY & PHYSICAL   ADMITTING DIAGNOSIS:  Bilateral foot drops, back pain.   HISTORY OF PRESENT ILLNESS:  [name] is a 70 year old individual  who has had significant back surgery with decompression L5-S1 on October 17, 2003.  He initially tolerated the procedure well.  He was noted to have some  right-sided dorsiflexor weakness and postoperatively notes that this was  unchanged.  The patient felt well for the first week-and-a-half to two weeks  and then started developing increasing problems with pain in his back  radiating to both lower extremities and more recently he has noted weakness  in not only the right leg but the left leg has become much more severe such  that he is unsteady on his feet.  Furthermore, he feels weak as though he  has had no energy and he feels dizzy.  He was seen a little over a week ago  and underwent a workup to rule out deep venous thrombosis by Doppler.  The  patient has been observed since that time but he feels that his condition is  continuing to deteriorate.   PAST MEDICAL HISTORY:  Significant for hyperlipidemia, hypertension, history  of a diaphragmatic hernia, and cardiomegaly.  He is on a number of  medications including Cardizem CD 300 mg a day, Diovan 160 mg at bedtime.  He also is using Lasix, potassium supplementation, in addition to a number  of other medications.  He has been using Percocet ineffectually for pain  control.  The patient has been on Cataflam as a nonsteroidal  antiinflammatory.   ALLERGIES:  He notes an allergy to CODEINE.  He also is allergic to  ALEVE.   PHYSICAL EXAMINATION:  VITAL SIGNS:  His blood pressure is 127/72, his heart  rate is 108, he is afebrile, his respirations are 16 and unlabored.  NEUROLOGIC:  He is an alert and oriented individual in moderate distress.  He rolls onto the side and his back incision is well-healed, flat, and  nontender to palpation.  His motor exam reveals that he has bilateral  complete foot drops.  His iliopsoas, quad, and gastroc strength appears to  be intact and strong bilaterally.  Sensation is decreased in the L5  distribution bilaterally.  HEENT:  Normal.  NECK:  Supple.  No masses are palpable.  LUNGS:  Clear to auscultation.  HEART:  Regular rate and rhythm.  ABDOMEN:  Soft, bowel sounds are positive, no masses are palpable.  EXTREMITIES:  Reveal no  cyanosis, clubbing, or edema.  There is right upper  extremity traumatic amputation of his right hand that is well healed over.  This apparently occurred at age 70.   IMPRESSION:  The patient has had worsening pain and dysfunction both lower  extremities with bilateral foot drop.  The worst concern is that he may have  a deep space infection.  At this point he will need significant workup  including an MRI of the lumbar spine plus gadolinium.  Will obtain some x-  rays, blood work to rule out a deep space infection.  I have requested a  consultation with the [facility] to help manage his medical  issues.  Will be admitted the patient this evening.                                                Stefani Dama, M.D.    Merla Riches  D:  11/24/2003  T:  11/24/2003  Job:  [id number]

## 2011-05-16 NOTE — Op Note (Signed)
NAME:  Dillon Wagner, Dillon Wagner NO.:  [id number]   MEDICAL RECORD NO.:  [id number]                   PATIENT TYPE:  INP   LOCATION:  NA                                   FACILITY:  [facility]   PHYSICIAN:  Clydene Fake, M.D.               DATE OF BIRTH:  10/06/1941   DATE OF PROCEDURE:  06/20/2003  DATE OF DISCHARGE:                                 OPERATIVE REPORT   PREOPERATIVE DIAGNOSES:  Spondylosis and foraminal stenosis, C5-6 and C6-7,  with left-sided radiculopathy.   POSTOPERATIVE DIAGNOSES:  Spondylosis and foraminal stenosis, C5-6 and C6-7,  with left-sided radiculopathy.   PROCEDURE:  Anterior cervical decompression, diskectomy, and fusion at C5-6  and C6-7 with LifeNet allograft bone and Tether anterior cervical plate.   SURGEON:  Clydene Fake, M.D.   ASSISTANT:  Stefani Dama, M.D.   ANESTHESIA:  General endotracheal tube.   ESTIMATED BLOOD LOSS:  Minimal.   BLOOD GIVEN:  None.   DRAINS:  None.   COMPLICATIONS:  None.   REASON FOR PROCEDURE:  The patient is a 70 year old gentleman who has had  neck and left arm pain running down into his fingers.  At this time has some  decreased strength in the left side.  An MRI was done showing severe  spondylosis and foraminal narrowing at 5-6 and 6-7, worse on the left side.  The patient is brought in for decompression and fusion.   PROCEDURE IN DETAIL:  The patient was brought in the operating room and  general anesthesia induced.  The patient was placed in 10 pounds of Holter  traction, prepped and draped in a sterile fashion.  The site of incision was  injected with 10 mL of 1% lidocaine with epinephrine.  An incision was then  made from the midline to the anterior border of the sternocleidomastoid  muscle on the left side of the neck, incision taken down to the platysma,  hemostasis obtained with Bovie cauterization.  The platysma incised with the  Bovie and blunt dissection was taken  through the anterior cervical fascia to  the anterior cervical spine.  A needle was placed in the interspace and an x-  ray was obtained showing this was the 5-6 interspace.  The disk space was  incised with a 15 blade and partial diskectomy was performed as the needle  was removed.  Longus colli muscles were reflected laterally on each side  using the Bovie.  A self-retaining retractor was placed.  The disk spaces at  5-6 and 6-7 were then incised with a 15 blade and diskectomy performed.  Distraction pins were placed in the C5 and C7 and the interspaces  distracted, osteophytes removed with Kerrison punches.  Diskectomy was  continued to be removed with pituitary rongeurs and curettes.  Microscope  was then brought in for dissection.  At this point a high-speed drill was  used to remove the rest of the disk cartilaginous end plate, to remove  osteophytes and drilling out the uncovertebral joints bilaterally.  Kerrison  1 and 2 mm punches were then used to reveal the posterior longitudinal  ligament and posterior osteophytes centrally and thin out laterally,  performing foraminotomies bilaterally.  When we were finished, we had good  decompression of the nerve roots at 5-6 bilaterally and the central sac.  Hemostasis was obtained with Gelfoam and thrombin.  This was then irrigated  out.  The end space was measured.  A 7 mm graft fit there well.  The end  plates were roughened up with the broach from the right facet and Gelfoam  __________ were placed.  Attention was taken back to 6-7.  Again diskectomy  was performed with pituitary rongeurs and curettes.  A high-speed drill was  used to remove osteophytes posterior.  The cartilaginous end plates were  stripped in the uncovertebral joints.  Kerrison 1 and 2 mm punches were used  to complete the decompression and perform foraminotomies, and there appears  to be a good decompression of the central canal and bilateral foramina,  especially the  left.  We measured this interspace for a 7 mm graft.  It was  prepared with the broach.  Two LifeNet allograft bones were then tapped into  place, first at 5-6, then at 6-7,  recessed a couple of millimeters.  The  wound was irrigated with antibiotic solution and the distraction devices  were removed, the distraction pins were removed, and the weight was removed  from the traction.  The wound was irrigated with antibiotic solution and a  Tether anterior cervical plate was placed over the anterior cervical spine,  two screws placed into C7, two into C5, and one into C6.  They were final-  tightened down.  A lateral x-ray was obtained showing good position of  screws, plate, and bone graft at 5-6.  We could not see any further due to  the patient's shoulders.  Clinically it looked good.  The retractor was  removed.  Hemostasis obtained with bipolar cauterization and Gelfoam and  thrombin.  Gelfoam was irrigated out, and we had good hemostasis.  The  platysma was closed with 3-0 Vicryl interrupted suture, the subcutaneous  tissue was closed with the same, and skin closed with Benzoin and Steri-  Strips.  A dressing was placed, a soft cervical collar was placed, and he  was awoken from anesthesia, transferred to the recovery room in stable  condition.                                               Clydene Fake, M.D.    JRH/MEDQ  D:  06/20/2003  T:  06/21/2003  Job:  [id number]

## 2011-05-16 NOTE — Discharge Summary (Signed)
   NAME:  Dillon Wagner, Dillon Wagner                       ACCOUNT NO.:  [id number]   MEDICAL RECORD NO.:  [id number]                   PATIENT TYPE:  INP   LOCATION:  3012                                 FACILITY:  [facility]   PHYSICIAN:  Hewitt Shorts, M.D.            DATE OF BIRTH:  [birth year]   DATE OF ADMISSION:  10/17/2003  DATE OF DISCHARGE:  10/20/2003                                 DISCHARGE SUMMARY   ADMISSION HISTORY AND PHYSICAL EXAMINATION:  The patient is a 70 year old  man, patient of Dr. Phoebe Perch, whom he admitted for treatment of lumbar  spondylosis and degenerative disk disease with foraminal stenosis, right  worse than left, with radiculopathy corresponding at the L5-S1 level.  For  details of the admission, see physical examination included in Dr. Doreen Beam  admission note.   HOSPITAL COURSE:  The patient was admitted, underwent L5-S1 posterior lumbar  interbody fusion with interbody cages and posterolateral arthrodesis with  posterior instrumentation and bone graft.   Following surgery he has been able to make gradual but steady progress.  He  was up and ambulating.  He was seen by PT and OT.  They have recommended a 3-  in-1 and a single-point cane.  He is asking to be discharged to home and has  been given instructions regarding wound care and activities.  His wound, in  fact, is healing well.  He is afebrile.  Discharge prescriptions were given  for Percocet 1-2 tablets p.o. q.4-6h. p.r.n. pain, 50 tablets, no refills  and Robaxin 500 mg q.i.d. muscle spasm, 50 tablets, no refills.  He was also  advised to use Advil 2 tablets three times daily p.r.n.   DISCHARGE DIAGNOSES:  1. Lumbar spondylosis.  2. Lumbar degenerative disk disease.  3. Lumbar radiculopathy.                                                Hewitt Shorts, M.D.    RWN/MEDQ  D:  10/20/2003  T:  10/21/2003  Job:  [id number]

## 2011-05-16 NOTE — Discharge Summary (Signed)
NAME:  Dillon Wagner, HINKS NO.:  [id number]   MEDICAL RECORD NO.:  [id number]                   PATIENT TYPE:  INP   LOCATION:  3039                                 FACILITY:  [facility]   PHYSICIAN:  Clydene Fake, M.D.               DATE OF BIRTH:  10-Jun-1941   DATE OF ADMISSION:  11/24/2003  DATE OF DISCHARGE:  12/01/2003                                 DISCHARGE SUMMARY   DIAGNOSES:  1. Neuropathy.  2. Status post decompressive lumbar laminectomy and fusion.   DISCHARGE DIAGNOSES:  1. Neuropathy.  2. Status post decompressive lumbar laminectomy and fusion.   PROCEDURES:  1. Lumbar puncture performed.  2. EMG nerve conduction velocities.   CONSULTATIONS:  1. Neurology.  2. Medicine hospitalists.   REASON FOR ADMISSION:  The patient is a 70 year old gentleman who underwent  decompressive laminectomy and fusion at L5-S1 October 17, 2003.  Initially  was doing well and did have a little numbness and a little burning  dysesthesia at L5 root on the right side postoperatively but improving and  having less and less back pain and thought he was doing well.  After a  couple weeks, he started having more pain in both lower extremities burning  in nature, more in the L5 root.  This continued to worsen.  He noted some  weakness in his legs with problems with dorsiflexion and worse actually on  the left.  This progressed.  He started to have some swelling in his legs  and underwent venous Dopplers which did not show any DVT.  He generally felt  bad.  Continued having these problems and presented to the emergency room  and was admitted.   HOSPITAL COURSE:  The patient was admitted and had labs to rule out  infection.  Cultures, sed rates, white count, C-reactive protein; all  apparently normal.  Hospitalists were consulted for evaluation.  TSH ended  up being done which was just slightly high.  That will need to be checked  somewhere down the road.   Neurology was consulted for evaluation.  They were  suspecting a progressive peripheral neuropathy, and so EMG nerve conduction  velocities which were done of the lower extremities which did show some  signs of some demyelination and peripheral neuropathy.  Further workup has  continued and a lot of that is still pending with the thought that he may be  a candidate for IVIGG.  He still needs a CT of the abdomen and pelvis, EMG  to the upper extremities and that will be done as an outpatient.  An LP was  done.  Protein was high and final results of that are still pending.  Meanwhile, the patient was placed on steroids when he came in.  He has had  some less pain but no improvement in function.  He never has all that much  back pain.  He does  wear his back brace when up.  The incision looks great.  MRI was done also which showed no sign of infection.  Postoperative changes  showed the roots were all decompressed and no stenosis.  PT was consulted  along with OT and rehab was also consulted but they thought that he would  need to be followed as an outpatient working with his treatment options.  He  is covered for outpatient IV medications and if needed that will be set up  at that point.  On December 01, 2003, the patient was discharged home in  stable but fair condition.  He needs EMG nerve conduction velocities of the  upper extremities, CT of abdomen and pelvis, and final results.  He will  follow up with Dr. Sandria Manly with those and a decision will be made on whether he  is a candidate for IVIGG.  He had been on Decadron before q.8h. and we will  start a slow wean over a couple weeks.  He will be seeing Dr. Sandria Manly before  the end of this and we may end up bumping that back up and then restart it  but he will wean off the Decadron over a 14-day period.  Upon discharge,  Neurontin has been increased at 300 q.4h.  He will be discharged with  Percocet p.r.n.  He already has amitriptyline 25 q.h.s. and  Flexeril p.r.n.  We will see him in 4-6 weeks for followup in our office.  He knows to  contact us sooner.  His TSH was slightly high and that will need to be  repeated per his medical doctor at some point.                                                Clydene Fake, M.D.    JRH/MEDQ  D:  12/01/2003  T:  12/01/2003  Job:  [id number]   cc:   Genene Churn. Love, M.D.  [address]  [address]  [place]  [place] [address]  Fax: [phone]   Jeanmarie Plant  [address] [place]  [place]  [place] [address]  Fax: [phone]

## 2012-05-04 DIAGNOSIS — Z9289 Personal history of other medical treatment: Secondary | ICD-10-CM

## 2012-05-04 HISTORY — DX: Personal history of other medical treatment: Z92.89

## 2012-05-04 HISTORY — PX: NM MYOCAR PERF WALL MOTION: HXRAD629

## 2012-10-31 HISTORY — PX: SHOULDER SURGERY: SHX246

## 2013-01-24 ENCOUNTER — Other Ambulatory Visit (HOSPITAL_COMMUNITY): Payer: Self-pay | Admitting: Cardiovascular Disease

## 2013-01-24 DIAGNOSIS — I714 Abdominal aortic aneurysm, without rupture: Secondary | ICD-10-CM

## 2013-02-03 ENCOUNTER — Ambulatory Visit (HOSPITAL_COMMUNITY)
Admission: RE | Admit: 2013-02-03 | Discharge: 2013-02-03 | Disposition: A | Discharge status: Home / Self Care | Payer: Medicare Other | Source: Ambulatory Visit | Attending: Cardiovascular Disease | Admitting: Cardiovascular Disease

## 2013-02-03 DIAGNOSIS — I714 Abdominal aortic aneurysm, without rupture, unspecified: Secondary | ICD-10-CM

## 2013-02-03 NOTE — Progress Notes (Signed)
Aorta Duplex Completed. Dillon Wagner D  

## 2013-07-14 ENCOUNTER — Encounter: Payer: Self-pay | Admitting: Physician Assistant

## 2013-07-14 ENCOUNTER — Ambulatory Visit (INDEPENDENT_AMBULATORY_CARE_PROVIDER_SITE_OTHER): Payer: Self-pay | Admitting: Physician Assistant

## 2013-07-14 VITALS — BP 110/60 | HR 60 | Ht 68.0 in | Wt 188.5 lb

## 2013-07-14 DIAGNOSIS — I10 Essential (primary) hypertension: Secondary | ICD-10-CM

## 2013-07-14 DIAGNOSIS — E785 Hyperlipidemia, unspecified: Secondary | ICD-10-CM

## 2013-07-14 DIAGNOSIS — I714 Abdominal aortic aneurysm, without rupture: Secondary | ICD-10-CM

## 2013-07-14 DIAGNOSIS — Z8679 Personal history of other diseases of the circulatory system: Secondary | ICD-10-CM | POA: Insufficient documentation

## 2013-07-14 NOTE — Patient Instructions (Signed)
Follow up with Dr. Berry in six months.   

## 2013-07-14 NOTE — Assessment & Plan Note (Signed)
Treated with Zocor. 

## 2013-07-14 NOTE — Assessment & Plan Note (Signed)
BP is well controlled 

## 2013-07-14 NOTE — Assessment & Plan Note (Signed)
Repeat AAA [place] next Feb 2015.

## 2013-07-14 NOTE — Progress Notes (Signed)
Date:  07/14/2013   ID:  Marline Backbone, DOB 05-26-1941, MRN [id number]  PCP:  Carmin Richmond, MD  Primary Cardiologist:  Allyson Sabal    History of Present Illness: Katie Moch is a 72 y.o. male with a history of hypertension, hyperlipidemia and type 2 diabetes. He quit smoking back in 1987 and smoked 30 pack-years prior to that. He has no family history of heart disease. He never had a heart attack or stroke. He did retired from [employer] where he worked up until 18 years ago. He had a Myoview stress test performed in our office May 04, 2012, for preoperative clearance before elective left shoulder replacement which was negative.  He had this done successfully at [facility] in November 2013. We have been following his abdominal aortic ultrasound which most recently done in March of last year was 4.9 x 4.6 cm, not large enough to warrant revascularization.  His most recent abdominal ultrasound to look at his aneurysm was February 2014 at which time it measured 4.9 cm.     The patient presents for a six month follow  Up.  Other than LEE worse on the left the patient denies nausea, vomiting, fever, chest pain, shortness of breath, orthopnea, dizziness, PND, cough, congestion, abdominal pain, hematochezia, melena.  Wt Readings from Last 3 Encounters:  07/14/13 188 lb 8 oz (85.503 kg)  08/20/10 199 lb (90.266 kg)  07/09/10 196 lb 8 oz (89.132 kg)     Past Medical History  Diagnosis Date  . AAA (abdominal aortic aneurysm)     4.9 x 4.6 cm  . Arthritis   . Diabetes   . Hyperthyroidism   . Hypertension   . Hyperlipidemia   . History of stress test 05/04/2012    negative stress test  . S/P wrist surgery 09/11/1958    right wrist remove    Current Outpatient Prescriptions  Medication Sig Dispense Refill  . aspirin 81 MG tablet Take 81 mg by mouth daily.      . diclofenac (VOLTAREN) 75 MG EC tablet Take 75 mg by mouth daily.      Marland Kitchen diltiazem (DILACOR XR) 240 MG 24  hr capsule Take 240 mg by mouth daily.      Marland Kitchen gabapentin (NEURONTIN) 600 MG tablet Take 600 mg by mouth 2 (two) times daily.      Marland Kitchen glipiZIDE (GLUCOTROL) 5 MG tablet Take 5 mg by mouth 2 (two) times daily before a meal.      . levothyroxine (SYNTHROID, LEVOTHROID) 75 MCG tablet Take 75 mcg by mouth daily before breakfast.      . loratadine (CLARITIN) 10 MG tablet Take 10 mg by mouth daily.      . metFORMIN (GLUCOPHAGE) 500 MG tablet Take 500 mg by mouth 2 (two) times daily with a meal.      . metoprolol succinate (TOPROL-XL) 100 MG 24 hr tablet Take 100 mg by mouth daily. Take with or immediately following a meal.      . omeprazole (PRILOSEC) 20 MG capsule Take 20 mg by mouth daily.      . simvastatin (ZOCOR) 20 MG tablet Take 20 mg by mouth daily.      . tamsulosin (FLOMAX) 0.4 MG CAPS Take 0.4 mg by mouth daily after breakfast.       No current facility-administered medications for this visit.    Allergies:    Allergies  Allergen Reactions  . Codeine     REACTION: gi upset  Social History:  The patient  reports that he quit smoking about 27 years ago. He does not have any smokeless tobacco history on file. He reports that he does not drink alcohol or use illicit drugs.   Family history:   Family History  Problem Relation Age of Onset  . Cancer Mother 46    ovarian  . Cancer Father 74    cancer in the stomach    ROS:  Please see the history of present illness.  All other systems reviewed and negative.   PHYSICAL EXAM: VS:  BP 110/60  Pulse 60  Ht 5\' 8"  (1.727 m)  Wt 188 lb 8 oz (85.503 kg)  BMI 28.67 kg/m2 Well nourished, well developed, in no acute distress HEENT: Pupils are equal round react to light accommodation extraocular movements are intact.  Neck: no JVDNo cervical lymphadenopathy. Cardiac: Regular rate and rhythm without murmurs rubs or gallops. Lungs:  clear to auscultation bilaterally, no wheezing, rhonchi or rales Abd: soft, nontender, positive bowel  sounds all quadrants, no hepatosplenomegaly Ext: no lower extremity edema.  2+ radial and dorsalis pedis pulses. Skin: warm and dry Neuro:  Grossly normal  EKG:   NSR 60 BPM  ASSESSMENT AND PLAN:  Problem List Items Addressed This Visit   HYPERTENSION     BP is well controlled.      Relevant Medications      aspirin 81 MG tablet      simvastatin (ZOCOR) 20 MG tablet      diltiazem (DILACOR XR) 240 MG 24 hr capsule      metoprolol succinate (TOPROL-XL) 100 MG 24 hr tablet   HYPERLIPIDEMIA     Treated with Zocor.    Relevant Medications      aspirin 81 MG tablet      simvastatin (ZOCOR) 20 MG tablet      diltiazem (DILACOR XR) 240 MG 24 hr capsule      metoprolol succinate (TOPROL-XL) 100 MG 24 hr tablet   AAA (abdominal aortic aneurysm)     Repeat AAA [place] next Feb 2015.    Relevant Medications      aspirin 81 MG tablet      simvastatin (ZOCOR) 20 MG tablet      diltiazem (DILACOR XR) 240 MG 24 hr capsule      metoprolol succinate (TOPROL-XL) 100 MG 24 hr tablet    Other Visit Diagnoses   HTN (hypertension)    -  Primary    Relevant Medications       aspirin 81 MG tablet       simvastatin (ZOCOR) 20 MG tablet       diltiazem (DILACOR XR) 240 MG 24 hr capsule       metoprolol succinate (TOPROL-XL) 100 MG 24 hr tablet    Other Relevant Orders       EKG 12-Lead

## 2013-10-19 ENCOUNTER — Telehealth: Payer: Self-pay | Admitting: *Deleted

## 2013-10-19 NOTE — Telephone Encounter (Signed)
Dr Allyson Sabal reviewed the chart and gave clearance for surgery.  Form was faxed

## 2014-04-18 ENCOUNTER — Encounter: Payer: Self-pay | Admitting: *Deleted

## 2014-04-26 ENCOUNTER — Encounter: Payer: Self-pay | Admitting: Cardiovascular Disease

## 2014-04-26 ENCOUNTER — Ambulatory Visit (INDEPENDENT_AMBULATORY_CARE_PROVIDER_SITE_OTHER): Payer: Medicare Other | Admitting: Cardiovascular Disease

## 2014-04-26 VITALS — BP 119/71 | HR 69 | Ht 70.0 in | Wt 194.0 lb

## 2014-04-26 DIAGNOSIS — I714 Abdominal aortic aneurysm, without rupture, unspecified: Secondary | ICD-10-CM

## 2014-04-26 DIAGNOSIS — E785 Hyperlipidemia, unspecified: Secondary | ICD-10-CM

## 2014-04-26 DIAGNOSIS — I10 Essential (primary) hypertension: Secondary | ICD-10-CM

## 2014-04-26 NOTE — Assessment & Plan Note (Signed)
Controlled on current medications 

## 2014-04-26 NOTE — Progress Notes (Signed)
04/26/2014 Dillon Wagner   27-Feb-1941  [id number]  Primary Physician DAVIS,JAMES W, MD Primary Cardiologist: Dillon Harp MD [name]   HPI:  The patient is a 73 year old, mildly overweight, married Caucasian male, father of 47, grandfather to 2 grandchildren who I last in the office 6 months ago. He is referred to me through the courtesy of Dr. Thurnell Garbe for evaluation of abdominal aortic aneurysm found on CT scanning performed because of abdominal pain June 20, 2011.   The patient's risk factors are positive for hypertension, hyperlipidemia and type 2 diabetes. He quit smoking back in 1987 and smoked 30 pack-years prior to that. He has no family history of heart disease. He never had a heart attack or stroke and denied any chest pain or shortness of breath. He did retired from [employer] where he worked up until 18 years ago. He had a Myoview stress test performed in our office May 04, 2012, for preoperative clearance before elective left shoulder replacement which was negative. He had this done successfully at [facility] in November. We have been following his abdominal aortic ultrasound which most recently done in March of last year was 4.9 x 4.6 cm, not large enough to warrant revascularization. Dr. Alean Rinne at [facility] primary care follows his cholesterol. Since I saw him back over a year ago he denies chest pain or shortness of breath.    Current Outpatient Prescriptions  Medication Sig Dispense Refill  . aspirin 81 MG tablet Take 81 mg by mouth daily.      . diclofenac (VOLTAREN) 75 MG EC tablet Take 75 mg by mouth daily.      Marland Kitchen diltiazem (DILACOR XR) 240 MG 24 hr capsule Take 240 mg by mouth daily.      Marland Kitchen gabapentin (NEURONTIN) 600 MG tablet Take 600 mg by mouth 2 (two) times daily.      Marland Kitchen glipiZIDE (GLUCOTROL) 5 MG tablet Take 5 mg by mouth 2 (two) times daily before a meal.      . levothyroxine (SYNTHROID, LEVOTHROID) 75 MCG tablet Take 75  mcg by mouth daily before breakfast.      . loratadine (CLARITIN) 10 MG tablet Take 10 mg by mouth daily.      . metFORMIN (GLUCOPHAGE) 500 MG tablet Take 500 mg by mouth 2 (two) times daily with a meal.      . metoprolol succinate (TOPROL-XL) 100 MG 24 hr tablet Take 100 mg by mouth daily. Take with or immediately following a meal.      . omeprazole (PRILOSEC) 20 MG capsule Take 20 mg by mouth daily.      . simvastatin (ZOCOR) 20 MG tablet Take 20 mg by mouth daily.      . tamsulosin (FLOMAX) 0.4 MG CAPS Take 0.4 mg by mouth daily after breakfast.       No current facility-administered medications for this visit.    Allergies  Allergen Reactions  . Codeine     REACTION: gi upset    History   Social History  . Marital Status: Married    Spouse Name: N/A    Number of Children: N/A  . Years of Education: N/A   Occupational History  . Not on file.   Social History Main Topics  . Smoking status: Former Smoker    Quit date: 04/28/1986  . Smokeless tobacco: Not on file  . Alcohol Use: No  . Drug Use: No  . Sexual Activity: Not on file  Other Topics Concern  . Not on file   Social History Narrative  . No narrative on file     Review of Systems: General: negative for chills, fever, night sweats or weight changes.  Cardiovascular: negative for chest pain, dyspnea on exertion, edema, orthopnea, palpitations, paroxysmal nocturnal dyspnea or shortness of breath Dermatological: negative for rash Respiratory: negative for cough or wheezing Urologic: negative for hematuria Abdominal: negative for nausea, vomiting, diarrhea, bright red blood per rectum, melena, or hematemesis Neurologic: negative for visual changes, syncope, or dizziness All other systems reviewed and are otherwise negative except as noted above.    Blood pressure 119/71, pulse 69, height 5\' 10"  (1.778 m), weight 194 lb (87.998 kg).  General appearance: alert and no distress Neck: no adenopathy, no carotid  bruit, no JVD, supple, symmetrical, trachea midline and thyroid not enlarged, symmetric, no tenderness/mass/nodules Lungs: clear to auscultation bilaterally Heart: regular rate and rhythm, S1, S2 normal, no murmur, click, rub or gallop Extremities: extremities normal, atraumatic, no cyanosis or edema  EKG normal sinus rhythm at 70 without ST or T wave changes  ASSESSMENT AND PLAN:   AAA (abdominal aortic aneurysm) When last checked his odometer aneurysm measured 4.9 x 4.6. This was in February of last year. We will recheck a abdominal ultrasound.  HYPERLIPIDEMIA On statin therapy followed by his PCP  HYPERTENSION Controlled on current medications      Dillon Harp MD [facility], [facility] 04/26/2014 1:07 PM

## 2014-04-26 NOTE — Patient Instructions (Signed)
  We will see you back in follow up in 6 months with Gaspar Bidding and 1 year with Dr Gwenlyn Found  Dr Gwenlyn Found has ordered abdominal doppler.

## 2014-04-26 NOTE — Assessment & Plan Note (Signed)
When last checked his odometer aneurysm measured 4.9 x 4.6. This was in February of last year. We will recheck a abdominal ultrasound.

## 2014-04-26 NOTE — Assessment & Plan Note (Signed)
On statin therapy followed by his PCP 

## 2014-05-05 ENCOUNTER — Ambulatory Visit (HOSPITAL_COMMUNITY)
Admission: RE | Admit: 2014-05-05 | Discharge: 2014-05-05 | Disposition: A | Discharge status: Home / Self Care | Payer: Medicare Other | Source: Ambulatory Visit | Attending: Internal Medicine | Admitting: Internal Medicine

## 2014-05-05 DIAGNOSIS — I714 Abdominal aortic aneurysm, without rupture, unspecified: Secondary | ICD-10-CM | POA: Insufficient documentation

## 2014-05-05 NOTE — Progress Notes (Signed)
Aorta Duplex Completed. Jennylee Uehara, BS, RDMS, RVT  

## 2014-05-08 ENCOUNTER — Encounter: Payer: Self-pay | Admitting: *Deleted

## 2014-09-14 ENCOUNTER — Telehealth (HOSPITAL_COMMUNITY): Payer: Self-pay | Admitting: *Deleted

## 2014-10-06 ENCOUNTER — Telehealth: Payer: Self-pay | Admitting: Cardiovascular Disease

## 2014-10-06 NOTE — Telephone Encounter (Signed)
Dillon Wagner, can you take a look at this please? Dr Gwenlyn Found is out of the office.

## 2014-10-06 NOTE — Telephone Encounter (Signed)
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

## 2014-10-06 NOTE — Telephone Encounter (Signed)
Patient notified it is OK to hold ASA for dermatology procedure per Cecilie Kicks, NP.

## 2014-10-06 NOTE — Telephone Encounter (Signed)
Yes ok to hold ASA for a few days.

## 2014-10-06 NOTE — Telephone Encounter (Signed)
Deferred to Dr. Gwenlyn Found.

## 2014-10-24 ENCOUNTER — Telehealth (HOSPITAL_COMMUNITY): Payer: Self-pay | Admitting: *Deleted

## 2014-11-07 ENCOUNTER — Encounter: Payer: Self-pay | Admitting: Physician Assistant

## 2014-11-07 ENCOUNTER — Ambulatory Visit (INDEPENDENT_AMBULATORY_CARE_PROVIDER_SITE_OTHER): Payer: Medicare Other | Admitting: Physician Assistant

## 2014-11-07 VITALS — BP 102/60 | HR 65 | Ht 70.0 in | Wt 193.3 lb

## 2014-11-07 DIAGNOSIS — I10 Essential (primary) hypertension: Secondary | ICD-10-CM

## 2014-11-07 DIAGNOSIS — I714 Abdominal aortic aneurysm, without rupture, unspecified: Secondary | ICD-10-CM

## 2014-11-07 DIAGNOSIS — E785 Hyperlipidemia, unspecified: Secondary | ICD-10-CM

## 2014-11-07 NOTE — Assessment & Plan Note (Signed)
On a statin.  Follow by PCP.   He needs redo left rotator cuff repair in December.  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 Mayoview in May 2013.  I think he will be ok for surgery.

## 2014-11-07 NOTE — Assessment & Plan Note (Signed)
Stable on Abd [place] May 2015

## 2014-11-07 NOTE — Assessment & Plan Note (Signed)
B/P controlled 

## 2014-11-07 NOTE — Patient Instructions (Signed)
Follow up with Dr. Gwenlyn Found in Six months.

## 2014-11-07 NOTE — Progress Notes (Signed)
Date:  11/07/2014   ID:  Dillon Wagner, DOB [birth year], MRN [id number]  PCP:  Coy Saunas, MD  Primary Cardiologist:  Dillon Wagner    History of Present Illness: Dillon Wagner is a 73 y.o. mildly overweight, married Caucasian male, father of 26, grandfather to 2 grandchildren wh saw Dr. Gwenlyn Wagner in April. He was originally referred through the courtesy of Dr. Thurnell Garbe for evaluation of abdominal aortic aneurysm Wagner on CT scanning performed because of abdominal pain June 20, 2011.   The patient's risk factors are positive for hypertension, hyperlipidemia and type 2 diabetes. He quit smoking back in 1987 and smoked 30 pack-years prior to that. He has no family history of heart disease. He never had a heart attack or stroke and denied any chest pain or shortness of breath. He did retired from [employer] where he worked up until 18 years ago. He had a Myoview stress test performed in our office May 04, 2012, for preoperative clearance before elective left shoulder replacement which was negative. He had this done successfully at [facility] in November 2014 and needs to have redo surgery in Dec 2015.   We have been following his abdominal aortic ultrasound which most recently done in March of last year was 4.9 x 4.6 cm, not large enough to warrant revascularization. The maximum diameter of his aneurysm in May 2015 was 4.9cm.Dr. Alean Rinne at [facility] primary care follows his cholesterol.   The patient currently denies nausea, vomiting, fever, chest pain, shortness of breath, orthopnea, dizziness, PND, cough, congestion, abdominal pain, hematochezia, melena, claudication.  He does have mild edema which is usually better in the morning.  Wt Readings from Last 3 Encounters:  11/07/14 193 lb 4.8 oz (87.68 kg)  04/26/14 194 lb (87.998 kg)  07/14/13 188 lb 8 oz (85.503 kg)     Past Medical History  Diagnosis Date  . AAA (abdominal aortic aneurysm)     4.9 x 4.6 cm  . Arthritis   .  Diabetes   . Hyperthyroidism   . Hypertension   . Hyperlipidemia   . History of stress test 05/04/2012    negative stress test  . S/P wrist surgery 09/11/1958    right wrist remove    Current Outpatient Prescriptions  Medication Sig Dispense Refill  . aspirin 81 MG tablet Take 81 mg by mouth daily.    . diclofenac (VOLTAREN) 75 MG EC tablet Take 75 mg by mouth daily.    Marland Kitchen diltiazem (DILACOR XR) 240 MG 24 hr capsule Take 240 mg by mouth daily.    Marland Kitchen gabapentin (NEURONTIN) 600 MG tablet Take 600 mg by mouth 2 (two) times daily.    Marland Kitchen glipiZIDE (GLUCOTROL) 5 MG tablet Take 5 mg by mouth 2 (two) times daily before a meal. Take 2.5mg  in the AM and 5mg  in the PM.    . levothyroxine (SYNTHROID, LEVOTHROID) 75 MCG tablet Take 75 mcg by mouth daily before breakfast.    . loratadine (CLARITIN) 10 MG tablet Take 10 mg by mouth daily.    . metFORMIN (GLUCOPHAGE) 500 MG tablet Take 750 mg by mouth 2 (two) times daily with a meal.     . metoprolol succinate (TOPROL-XL) 100 MG 24 hr tablet Take 100 mg by mouth daily. Take with or immediately following a meal.    . omeprazole (PRILOSEC) 20 MG capsule Take 20 mg by mouth daily.    . simvastatin (ZOCOR) 20 MG tablet Take 20 mg by mouth  daily.    . tamsulosin (FLOMAX) 0.4 MG CAPS Take 0.4 mg by mouth daily after breakfast.     No current facility-administered medications for this visit.    Allergies:    Allergies  Allergen Reactions  . Codeine     REACTION: gi upset    Social History:  The patient  reports that he quit smoking about 28 years ago. He does not have any smokeless tobacco history on file. He reports that he does not drink alcohol or use illicit drugs.   Family history:   Family History  Problem Relation Age of Onset  . Cancer Mother 25    ovarian  . Cancer Father 39    cancer in the stomach    ROS:  Please see the history of present illness.  All other systems reviewed and negative.   PHYSICAL EXAM: VS:  BP 102/60 mmHg   Pulse 65  Ht 5\' 10"  (1.778 m)  Wt 193 lb 4.8 oz (87.68 kg)  BMI 27.74 kg/m2 Well nourished, well developed, in no acute distress HEENT: Pupils are equal round react to light accommodation extraocular movements are intact.  Neck: no JVDNo cervical lymphadenopathy. Cardiac: Regular rate and rhythm without murmurs rubs or gallops. Lungs:  clear to auscultation bilaterally, no wheezing, rhonchi or rales Abd: soft, nontender, positive bowel sounds all quadrants, no hepatosplenomegaly Ext: Trace lower extremity edema.  2+ radial and dorsalis pedis pulses. Skin: warm and dry Neuro:  Grossly normal  EKG:  NSR65, PVC.   ASSESSMENT AND PLAN:  Problem List Items Addressed This Visit    AAA (abdominal aortic aneurysm)    Stable on Abd [place] May 2015    Essential hypertension - Primary    BP controlled     Relevant Orders      EKG 12-Lead   HLD (hyperlipidemia)    On a statin.  Follow by PCP.   He needs redo left rotator cuff repair in December.  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 Mayoview in May 2013.  I think he will be ok for surgery.

## 2014-12-12 ENCOUNTER — Telehealth (HOSPITAL_COMMUNITY): Payer: Self-pay | Admitting: *Deleted

## 2014-12-13 ENCOUNTER — Other Ambulatory Visit (HOSPITAL_COMMUNITY): Payer: Self-pay | Admitting: Cardiovascular Disease

## 2014-12-13 DIAGNOSIS — I714 Abdominal aortic aneurysm, without rupture, unspecified: Secondary | ICD-10-CM

## 2014-12-14 ENCOUNTER — Ambulatory Visit (HOSPITAL_COMMUNITY)
Admission: RE | Admit: 2014-12-14 | Discharge: 2014-12-14 | Disposition: A | Discharge status: Home / Self Care | Payer: Medicare Other | Source: Ambulatory Visit | Attending: Cardiovascular Disease | Admitting: Cardiovascular Disease

## 2014-12-14 DIAGNOSIS — I714 Abdominal aortic aneurysm, without rupture, unspecified: Secondary | ICD-10-CM

## 2014-12-14 NOTE — Progress Notes (Signed)
Abdominal Aorta Completed.  Oda Cogan, BS, RDMS, RVT

## 2014-12-20 ENCOUNTER — Encounter: Payer: Self-pay | Admitting: *Deleted

## 2015-04-03 ENCOUNTER — Other Ambulatory Visit (HOSPITAL_COMMUNITY): Payer: Self-pay | Admitting: Cardiovascular Disease

## 2015-04-03 DIAGNOSIS — I714 Abdominal aortic aneurysm, without rupture, unspecified: Secondary | ICD-10-CM

## 2015-04-05 ENCOUNTER — Encounter (HOSPITAL_COMMUNITY): Payer: Self-pay | Admitting: *Deleted

## 2015-06-08 ENCOUNTER — Encounter: Payer: Self-pay | Admitting: Cardiovascular Disease

## 2015-06-08 ENCOUNTER — Encounter (HOSPITAL_COMMUNITY): Payer: Medicare Other

## 2015-06-08 ENCOUNTER — Ambulatory Visit (INDEPENDENT_AMBULATORY_CARE_PROVIDER_SITE_OTHER): Payer: Medicare Other | Admitting: Cardiovascular Disease

## 2015-06-08 ENCOUNTER — Ambulatory Visit (HOSPITAL_COMMUNITY)
Admission: RE | Admit: 2015-06-08 | Discharge: 2015-06-08 | Disposition: A | Discharge status: Home / Self Care | Payer: Medicare Other | Source: Ambulatory Visit | Attending: Cardiology | Admitting: Cardiology

## 2015-06-08 VITALS — BP 94/60 | HR 65 | Ht 70.0 in | Wt 192.0 lb

## 2015-06-08 DIAGNOSIS — I714 Abdominal aortic aneurysm, without rupture, unspecified: Secondary | ICD-10-CM

## 2015-06-08 DIAGNOSIS — I10 Essential (primary) hypertension: Secondary | ICD-10-CM | POA: Diagnosis not present

## 2015-06-08 DIAGNOSIS — I739 Peripheral vascular disease, unspecified: Secondary | ICD-10-CM | POA: Diagnosis not present

## 2015-06-08 NOTE — Patient Instructions (Signed)
Your physician wants you to follow-up in: 1 year with Dr Berry. You will receive a reminder letter in the mail two months in advance. If you don't receive a letter, please call our office to schedule the follow-up appointment.  

## 2015-06-08 NOTE — Assessment & Plan Note (Signed)
History of hyperlipidemia  On simvastatin 20 mg a day followed by his PCP

## 2015-06-08 NOTE — Progress Notes (Signed)
06/08/2015 Dillon Wagner   1941-05-20  [id number]  Primary Physician DAVIS,JAMES W, MD Primary Cardiologist: Lorretta Harp MD Dillon Wagner   HPI:   The patient is a 74 year old, mildly overweight, married Caucasian male, father of 50, grandfather to 2 grandchildren who I last in the office 12 months ago. He is referred to me through the courtesy of Dr. Thurnell Garbe for evaluation of abdominal aortic aneurysm found on CT scanning performed because of abdominal pain June 20, 2011.   The patient's risk factors are positive for hypertension, hyperlipidemia and type 2 diabetes. He quit smoking back in 1987 and smoked 30 pack-years prior to that. He has no family history of heart disease. He never had a heart attack or stroke and denied any chest pain or shortness of breath. He did retired from [employer] where he worked up until 18 years ago. He had a Myoview stress test performed in our office May 04, 2012, for preoperative clearance before elective left shoulder replacement which was negative. He had this done successfully at [facility] in November. We have been following his abdominal aortic ultrasound which most recently done in March of last year was 4.9 x 4.6 cm, not large enough to warrant revascularization. Abdominal ultrasound performed today revealed his aneurysm measured 4.5 x 4.5 cm. Dr. Alean Rinne at [facility] primary care follows his cholesterol. Since I saw him back over a year ago he denies chest pain or shortness of breath.   Current Outpatient Prescriptions  Medication Sig Dispense Refill  . aspirin 81 MG tablet Take 81 mg by mouth daily.    . diclofenac (VOLTAREN) 75 MG EC tablet Take 75 mg by mouth daily.    Marland Kitchen diltiazem (DILACOR XR) 240 MG 24 hr capsule Take 240 mg by mouth daily.    Marland Kitchen gabapentin (NEURONTIN) 600 MG tablet Take 600 mg by mouth 2 (two) times daily.    Marland Kitchen glipiZIDE (GLUCOTROL) 5 MG tablet Take 5 mg by mouth 2 (two) times daily before a  meal. Take 2.5mg  in the AM and 5mg  in the PM.    . levothyroxine (SYNTHROID, LEVOTHROID) 75 MCG tablet Take 75 mcg by mouth daily before breakfast.    . loratadine (CLARITIN) 10 MG tablet Take 10 mg by mouth daily.    . metFORMIN (GLUCOPHAGE) 1000 MG tablet Take 1,000 mg by mouth 2 (two) times daily.    . metoprolol succinate (TOPROL-XL) 100 MG 24 hr tablet Take 100 mg by mouth daily. Take with or immediately following a meal.    . omeprazole (PRILOSEC) 20 MG capsule Take 20 mg by mouth 2 (two) times daily before a meal.     . simvastatin (ZOCOR) 20 MG tablet Take 20 mg by mouth daily.    . tamsulosin (FLOMAX) 0.4 MG CAPS Take 0.4 mg by mouth daily after breakfast.     No current facility-administered medications for this visit.    Allergies  Allergen Reactions  . Codeine     REACTION: gi upset    History   Social History  . Marital Status: Married    Spouse Name: N/A  . Number of Children: N/A  . Years of Education: N/A   Occupational History  . Not on file.   Social History Main Topics  . Smoking status: Former Smoker    Quit date: 04/28/1986  . Smokeless tobacco: Not on file  . Alcohol Use: No  . Drug Use: No  . Sexual Activity: Not on  file   Other Topics Concern  . Not on file   Social History Narrative     Review of Systems: General: negative for chills, fever, night sweats or weight changes.  Cardiovascular: negative for chest pain, dyspnea on exertion, edema, orthopnea, palpitations, paroxysmal nocturnal dyspnea or shortness of breath Dermatological: negative for rash Respiratory: negative for cough or wheezing Urologic: negative for hematuria Abdominal: negative for nausea, vomiting, diarrhea, bright red blood per rectum, melena, or hematemesis Neurologic: negative for visual changes, syncope, or dizziness All other systems reviewed and are otherwise negative except as noted above.    Blood pressure 94/60, pulse 65, height 5\' 10"  (1.778 m), weight 192 lb  (87.091 kg).  General appearance: alert and no distress Neck: no adenopathy, no carotid bruit, no JVD, supple, symmetrical, trachea midline and thyroid not enlarged, symmetric, no tenderness/mass/nodules Lungs: clear to auscultation bilaterally Heart: regular rate and rhythm, S1, S2 normal, no murmur, click, rub or gallop Extremities: extremities normal, atraumatic, no cyanosis or edema  EKG normal sinus rhythm at 65 with nonspecific ST and T-wave changes and occasional PVCs. I personally reviewed this EKG  ASSESSMENT AND PLAN:   HLD (hyperlipidemia) History of hyperlipidemia  On simvastatin 20 mg a day followed by his PCP  Essential hypertension History of hypertension with blood pressure measured at 94/60. He is on diltiazem XR 240 mg as well as metoprolol XL 100 milligrams daily. Continue current meds at current dosing  AAA (abdominal aortic aneurysm) History of abdominal aortic aneurysm which we have been following by 2+ ultrasound on a semiannual basis. This was recently performed today showing maximum dimensions of 4.5 x 4.5 cm scheduled to be repeated again in 6 months.      Lorretta Harp MD FACP,FACC,FAHA, [facility] 06/08/2015 10:58 AM

## 2015-06-08 NOTE — Assessment & Plan Note (Signed)
History of abdominal aortic aneurysm which we have been following by 2+ ultrasound on a semiannual basis. This was recently performed today showing maximum dimensions of 4.5 x 4.5 cm scheduled to be repeated again in 6 months.

## 2015-06-08 NOTE — Assessment & Plan Note (Addendum)
History of hypertension with blood pressure measured at 94/60. He is on diltiazem XR 240 mg as well as metoprolol XL 100 milligrams daily. Continue current meds at current dosing

## 2015-06-19 ENCOUNTER — Other Ambulatory Visit: Payer: Self-pay | Admitting: *Deleted

## 2015-06-19 DIAGNOSIS — I714 Abdominal aortic aneurysm, without rupture, unspecified: Secondary | ICD-10-CM

## 2015-12-27 ENCOUNTER — Other Ambulatory Visit: Payer: Self-pay | Admitting: Cardiovascular Disease

## 2015-12-27 DIAGNOSIS — I739 Peripheral vascular disease, unspecified: Secondary | ICD-10-CM

## 2016-01-17 ENCOUNTER — Other Ambulatory Visit: Payer: Self-pay | Admitting: Cardiovascular Disease

## 2016-01-17 ENCOUNTER — Ambulatory Visit (HOSPITAL_COMMUNITY)
Admission: RE | Admit: 2016-01-17 | Discharge: 2016-01-17 | Disposition: A | Discharge status: Home / Self Care | Payer: Medicare Other | Source: Ambulatory Visit | Attending: Cardiovascular Disease | Admitting: Cardiovascular Disease

## 2016-01-17 DIAGNOSIS — E119 Type 2 diabetes mellitus without complications: Secondary | ICD-10-CM | POA: Insufficient documentation

## 2016-01-17 DIAGNOSIS — I739 Peripheral vascular disease, unspecified: Secondary | ICD-10-CM

## 2016-01-17 DIAGNOSIS — I714 Abdominal aortic aneurysm, without rupture, unspecified: Secondary | ICD-10-CM

## 2016-01-17 DIAGNOSIS — I700 Atherosclerosis of aorta: Secondary | ICD-10-CM | POA: Diagnosis not present

## 2016-01-17 DIAGNOSIS — E785 Hyperlipidemia, unspecified: Secondary | ICD-10-CM | POA: Diagnosis not present

## 2016-01-17 DIAGNOSIS — R209 Unspecified disturbances of skin sensation: Secondary | ICD-10-CM | POA: Diagnosis not present

## 2016-01-17 DIAGNOSIS — I10 Essential (primary) hypertension: Secondary | ICD-10-CM | POA: Diagnosis not present

## 2016-03-04 ENCOUNTER — Encounter: Payer: Self-pay | Admitting: Cardiovascular Disease

## 2016-03-04 ENCOUNTER — Ambulatory Visit (INDEPENDENT_AMBULATORY_CARE_PROVIDER_SITE_OTHER): Payer: Medicare Other | Admitting: Cardiovascular Disease

## 2016-03-04 VITALS — BP 126/68 | HR 72 | Ht 70.0 in | Wt 193.0 lb

## 2016-03-04 DIAGNOSIS — I714 Abdominal aortic aneurysm, without rupture, unspecified: Secondary | ICD-10-CM

## 2016-03-04 NOTE — Assessment & Plan Note (Signed)
History of hypertension blood pressure measured at 126/68. He is on diltiazem, metoprolol. Continue current meds at current dosing

## 2016-03-04 NOTE — Assessment & Plan Note (Signed)
History of hyperlipidemia on statin therapy followed by his PCP 

## 2016-03-04 NOTE — Patient Instructions (Addendum)
Medication Instructions:  Your physician recommends that you continue on your current medications as directed. Please refer to the Current Medication list given to you today.   Labwork: I will get your lab work from your Primary Care Physician.   Testing/Procedures:   Follow-Up: You have been referred to Dr. Tilden Dome - AAA  Your physician wants you to follow-up in: 12 months with Dr. Gwenlyn Found. You will receive a reminder letter in the mail two months in advance. If you don't receive a letter, please call our office to schedule the follow-up appointment.   Any Other Special Instructions Will Be Listed Below (If Applicable).     If you need a refill on your cardiac medications before your next appointment, please call your pharmacy.

## 2016-03-04 NOTE — Progress Notes (Signed)
03/04/2016 Dillon Wagner   [birth year]  [id number]  Primary Physician DAVIS,JAMES W, MD Primary Cardiologist: Lorretta Harp MD Dillon Wagner   HPI:  The patient is a 75 year old, mildly overweight, married Caucasian male, father of 75, grandfather to 2 grandchildren who I last in the office 12 months ago. He is referred to me through the courtesy of Dr. Thurnell Garbe for evaluation of abdominal aortic aneurysm found on CT scanning performed because of abdominal pain June 20, 2011.   The patient's risk factors are positive for hypertension, hyperlipidemia and type 2 diabetes. He quit smoking back in 1987 and smoked 30 pack-years prior to that. He has no family history of heart disease. He never had a heart attack or stroke and denied any chest pain or shortness of breath. He did retired from [employer] where he worked up until 18 years ago. He had a Myoview stress test performed in our office May 04, 2012, for preoperative clearance before elective left shoulder replacement which was negative. He had this done successfully at [facility] in November. We have been following his abdominal aortic ultrasound which most recently done in March of last year was 4.9 x 4.6 cm, not large enough to warrant revascularization. Abdominal ultrasound performed 01/17/16 revealed a maximum dimension of 4.9 cm. Dr. Alean Rinne at [facility] primary care follows his cholesterol. Since I saw him back over a year ago he denies chest pain or shortness of breath.   Current Outpatient Prescriptions  Medication Sig Dispense Refill  . aspirin 81 MG tablet Take 81 mg by mouth daily.    . diclofenac (VOLTAREN) 75 MG EC tablet Take 75 mg by mouth daily.    Marland Kitchen diltiazem (DILACOR XR) 240 MG 24 hr capsule Take 240 mg by mouth daily.    Marland Kitchen gabapentin (NEURONTIN) 600 MG tablet Take 600 mg by mouth 2 (two) times daily.    Marland Kitchen glipiZIDE (GLUCOTROL) 5 MG tablet Take 5 mg by mouth 2 (two) times daily before a meal.  Take 2.5mg  in the AM and 5mg  in the PM.    . levothyroxine (SYNTHROID, LEVOTHROID) 75 MCG tablet Take 75 mcg by mouth daily before breakfast.    . loratadine (CLARITIN) 10 MG tablet Take 10 mg by mouth daily.    . metFORMIN (GLUCOPHAGE) 1000 MG tablet Take 1,000 mg by mouth 2 (two) times daily.    . metoprolol succinate (TOPROL-XL) 100 MG 24 hr tablet Take 100 mg by mouth daily. Take with or immediately following a meal.    . omeprazole (PRILOSEC) 20 MG capsule Take 20 mg by mouth 2 (two) times daily before a meal.     . simvastatin (ZOCOR) 20 MG tablet Take 20 mg by mouth daily.    . tamsulosin (FLOMAX) 0.4 MG CAPS Take 0.4 mg by mouth daily after breakfast.     No current facility-administered medications for this visit.    Allergies  Allergen Reactions  . Codeine     REACTION: gi upset    Social History   Social History  . Marital Status: Married    Spouse Name: N/A  . Number of Children: N/A  . Years of Education: N/A   Occupational History  . Not on file.   Social History Main Topics  . Smoking status: Former Smoker    Quit date: 04/28/1986  . Smokeless tobacco: Not on file  . Alcohol Use: No  . Drug Use: No  . Sexual Activity: Not on file  Other Topics Concern  . Not on file   Social History Narrative     Review of Systems: General: negative for chills, fever, night sweats or weight changes.  Cardiovascular: negative for chest pain, dyspnea on exertion, edema, orthopnea, palpitations, paroxysmal nocturnal dyspnea or shortness of breath Dermatological: negative for rash Respiratory: negative for cough or wheezing Urologic: negative for hematuria Abdominal: negative for nausea, vomiting, diarrhea, bright red blood per rectum, melena, or hematemesis Neurologic: negative for visual changes, syncope, or dizziness All other systems reviewed and are otherwise negative except as noted above.    Blood pressure 126/68, pulse 72, height 5\' 10"  (1.778 m), weight 193  lb (87.544 kg).  General appearance: alert and no distress Neck: no adenopathy, no carotid bruit, no JVD, supple, symmetrical, trachea midline and thyroid not enlarged, symmetric, no tenderness/mass/nodules Lungs: clear to auscultation bilaterally Heart: regular rate and rhythm, S1, S2 normal, no murmur, click, rub or gallop Extremities: extremities normal, atraumatic, no cyanosis or edema  EKG not performed today  ASSESSMENT AND PLAN:   HLD (hyperlipidemia) History of hyperlipidemia on statin therapy followed by his PCP  Essential hypertension History of hypertension blood pressure measured at 126/68. He is on diltiazem, metoprolol. Continue current meds at current dosing  AAA (abdominal aortic aneurysm) History of abdominal aortic aneurysm currently measuring 4.9 cm. I'm going to get abdominal pelvic CT and a gram and refer him to Dr. Trula Slade for consideration of endoluminal stent grafting.      Lorretta Harp MD FACP,FACC,FAHA, [facility] 03/04/2016 10:59 AM

## 2016-03-04 NOTE — Assessment & Plan Note (Signed)
History of abdominal aortic aneurysm currently measuring 4.9 cm. I'm going to get abdominal pelvic CT and a gram and refer him to Dr. Trula Slade for consideration of endoluminal stent grafting.

## 2016-03-05 ENCOUNTER — Telehealth: Payer: Self-pay

## 2016-03-05 DIAGNOSIS — Z79899 Other long term (current) drug therapy: Secondary | ICD-10-CM

## 2016-03-05 NOTE — Telephone Encounter (Signed)
Pt wanted to do lab work at PCP.  PCP unable to do lab work for other providers.  Pt going to go to [address] office on 3/14 for lab work

## 2016-03-11 ENCOUNTER — Other Ambulatory Visit (INDEPENDENT_AMBULATORY_CARE_PROVIDER_SITE_OTHER): Payer: Medicare Other | Admitting: *Deleted

## 2016-03-11 DIAGNOSIS — I10 Essential (primary) hypertension: Secondary | ICD-10-CM

## 2016-03-11 LAB — BASIC METABOLIC PANEL
BUN: 16 mg/dL (ref 7–25)
CHLORIDE: 105 mmol/L (ref 98–110)
CO2: 21 mmol/L (ref 20–31)
CREATININE: 1.07 mg/dL (ref 0.70–1.18)
Calcium: 9.2 mg/dL (ref 8.6–10.3)
Glucose, Bld: 197 mg/dL — ABNORMAL HIGH (ref 65–99)
Potassium: 4.6 mmol/L (ref 3.5–5.3)
Sodium: 137 mmol/L (ref 135–146)

## 2016-03-12 ENCOUNTER — Ambulatory Visit (INDEPENDENT_AMBULATORY_CARE_PROVIDER_SITE_OTHER)
Admission: RE | Admit: 2016-03-12 | Discharge: 2016-03-12 | Disposition: A | Discharge status: Home / Self Care | Payer: Medicare Other | Source: Ambulatory Visit | Attending: Cardiovascular Disease | Admitting: Cardiovascular Disease

## 2016-03-12 DIAGNOSIS — I714 Abdominal aortic aneurysm, without rupture, unspecified: Secondary | ICD-10-CM

## 2016-03-12 MED ORDER — IOHEXOL 350 MG/ML SOLN
100.0000 mL | Freq: Once | INTRAVENOUS | Status: AC | PRN
  Administered 2016-03-12: 100 mL via INTRAVENOUS

## 2016-03-17 ENCOUNTER — Telehealth: Payer: Self-pay | Admitting: Cardiovascular Disease

## 2016-03-17 ENCOUNTER — Encounter: Payer: Self-pay | Admitting: Surgery

## 2016-03-17 NOTE — Telephone Encounter (Signed)
No answer when dialed. 

## 2016-03-17 NOTE — Telephone Encounter (Signed)
New message   Pt wants to speak to rn

## 2016-03-21 ENCOUNTER — Other Ambulatory Visit: Payer: Self-pay | Admitting: *Deleted

## 2016-03-21 ENCOUNTER — Ambulatory Visit (INDEPENDENT_AMBULATORY_CARE_PROVIDER_SITE_OTHER): Payer: Medicare Other | Admitting: Surgery

## 2016-03-21 ENCOUNTER — Ambulatory Visit (HOSPITAL_COMMUNITY)
Admission: RE | Admit: 2016-03-21 | Discharge: 2016-03-21 | Disposition: A | Discharge status: Home / Self Care | Payer: Medicare Other | Source: Ambulatory Visit | Attending: Surgery | Admitting: Surgery

## 2016-03-21 ENCOUNTER — Encounter: Payer: Self-pay | Admitting: Surgery

## 2016-03-21 VITALS — BP 113/65 | HR 68 | Temp 97.3°F | Resp 18 | Ht 70.0 in | Wt 188.0 lb

## 2016-03-21 DIAGNOSIS — E785 Hyperlipidemia, unspecified: Secondary | ICD-10-CM | POA: Insufficient documentation

## 2016-03-21 DIAGNOSIS — I10 Essential (primary) hypertension: Secondary | ICD-10-CM | POA: Diagnosis not present

## 2016-03-21 DIAGNOSIS — E119 Type 2 diabetes mellitus without complications: Secondary | ICD-10-CM | POA: Diagnosis not present

## 2016-03-21 DIAGNOSIS — I6523 Occlusion and stenosis of bilateral carotid arteries: Secondary | ICD-10-CM

## 2016-03-21 DIAGNOSIS — I714 Abdominal aortic aneurysm, without rupture, unspecified: Secondary | ICD-10-CM

## 2016-03-21 NOTE — Progress Notes (Signed)
Patient name: Dillon Wagner MRN: [id number] DOB: 14-Feb-1941 Sex: male   Referred by: Dr. Gwenlyn Found  Reason for referral:  Chief Complaint  Patient presents with  . New Evaluation    AAA    HISTORY OF PRESENT ILLNESS: This is a 75 year old gentleman who comes in today for evaluation of an abdominal aortic aneurysm.  He has been followed with serial ultrasounds.  He recently went for a CT scan which showed a 5.9 cm aneurysm.  He denies any abdominal pain or back pain.  The patient suffers from diabetes.  He states his blood sugars are usually in the 120 range and his hemoglobin A1c was around 6.  He is medically managed for hypercholesterolemia with a statin.  His blood pressure has been controlled.  He is on aspirin.  He denies any significant cardiac issues  Past Medical History  Diagnosis Date  . AAA (abdominal aortic aneurysm) (HCC)     4.9 x 4.6 cm  . Arthritis   . Diabetes ([place])   . Hyperthyroidism   . Hypertension   . Hyperlipidemia   . History of stress test 05/04/2012    negative stress test  . S/P wrist surgery 09/11/1958    right wrist remove  He is a former smoker.  Past Surgical History  Procedure Laterality Date  . Shoulder surgery Left 10/31/2012  . Neck surgery  05/2003    Dr Consuello Masse  . Back surgery  2004  . Stomach sugery  1969    Infected fat on left side  . Appendectomy  1969  . Elbow surgery  1957    Pins  . Thumb surgery  01/1995  . Nm myocar perf wall motion  05/04/2012    protocol:Lexiscan, lung to heart ratio 38%, post EF 67%, small fixed apical defect    Social History   Social History  . Marital Status: Married    Spouse Name: N/A  . Number of Children: N/A  . Years of Education: N/A   Occupational History  . Not on file.   Social History Main Topics  . Smoking status: Former Smoker    Quit date: 04/28/1986  . Smokeless tobacco: Former Systems developer    Quit date: 03/22/1987  . Alcohol Use: No  . Drug Use: No  . Sexual Activity: Not  on file   Other Topics Concern  . Not on file   Social History Narrative    Family History  Problem Relation Age of Onset  . Cancer Mother 51    ovarian  . Cancer Father 10    cancer in the stomach    Allergies as of 03/21/2016 - Review Complete 03/21/2016  Allergen Reaction Noted  . Codeine      Current Outpatient Prescriptions on File Prior to Visit  Medication Sig Dispense Refill  . aspirin 81 MG tablet Take 81 mg by mouth daily.    . diclofenac (VOLTAREN) 75 MG EC tablet Take 75 mg by mouth daily.    Marland Kitchen diltiazem (DILACOR XR) 240 MG 24 hr capsule Take 240 mg by mouth daily.    Marland Kitchen gabapentin (NEURONTIN) 600 MG tablet Take 600 mg by mouth 2 (two) times daily.    Marland Kitchen glipiZIDE (GLUCOTROL) 5 MG tablet Take 5 mg by mouth 2 (two) times daily before a meal. Take 2.5mg  in the AM and 5mg  in the PM.    . levothyroxine (SYNTHROID, LEVOTHROID) 75 MCG tablet Take 50 mcg by mouth daily before breakfast.     .  loratadine (CLARITIN) 10 MG tablet Take 10 mg by mouth daily.    . metFORMIN (GLUCOPHAGE) 1000 MG tablet Take 1,000 mg by mouth 2 (two) times daily.    . metoprolol succinate (TOPROL-XL) 100 MG 24 hr tablet Take 100 mg by mouth daily. Take with or immediately following a meal.    . omeprazole (PRILOSEC) 20 MG capsule Take 20 mg by mouth 2 (two) times daily before a meal.     . simvastatin (ZOCOR) 20 MG tablet Take 20 mg by mouth daily.    . tamsulosin (FLOMAX) 0.4 MG CAPS Take 0.4 mg by mouth daily after breakfast.     No current facility-administered medications on file prior to visit.     REVIEW OF SYSTEMS: Cardiovascular: No chest pain, chest pressure, palpitations, orthopnea, or dyspnea on exertion. No claudication or rest pain,  No history of DVT or phlebitis. Pulmonary: No productive cough, asthma or wheezing. Neurologic: No weakness, paresthesias, aphasia, or amaurosis. No dizziness. Hematologic: No bleeding problems or clotting disorders. Musculoskeletal:Right wrist  amputation. Gastrointestinal: No blood in stool or hematemesis Genitourinary: No dysuria or hematuria. Psychiatric:: No history of major depression. Integumentary: No rashes or ulcers. Constitutional: No fever or chills.  PHYSICAL EXAMINATION:  Filed Vitals:   03/21/16 1434  BP: 113/65  Pulse: 68  Temp: 97.3 F (36.3 C)  Resp: 18  Height: 5\' 10"  (1.778 m)  Weight: 188 lb (85.276 kg)  SpO2: 97%   Body mass index is 26.98 kg/(m^2). General: The patient appears their stated age.   HEENT:  No gross abnormalities Pulmonary: Respirations are non-labored Abdomen: Soft and non-tender Aorta is easily palpable and nontender Musculoskeletal: There are no major deformities.   Neurologic: No focal weakness or paresthesias are detected, Skin: There are no ulcer or rashes noted. Psychiatric: The patient has normal affect. Cardiovascular: There is a regular rate and rhythm without significant murmur appreciated.  No carotid bruits.  Palpable pedal pulses.  Diagnostic Studies: I have reviewed his CT and exam which shows a 5.9 cm infrarenal abdominal aortic aneurysm    Assessment:  Abdominal aortic aneurysm Plan: I discussed our treatment options with the patient.  We have decided to proceed with endovascular repair.  I discussed the risks and benefits including but not limited to the risk of cardiopulmonary complications, intestinal ischemia, lower extremity ischemia, bleeding, renal dysfunction.  All his questions were answered and he wishes to proceed.  We also discussed possibility of an endoleak and the need for future interventions as well as follow-up surveillance.  His procedure has tentatively been scheduled for Thursday, April 20.  I will get preoperative carotid Doppler studies prior to this.     Eldridge Abrahams, M.D. [facility] Office: [phone] Pager:  [phone]

## 2016-03-24 ENCOUNTER — Other Ambulatory Visit: Payer: Self-pay

## 2016-04-09 ENCOUNTER — Encounter (HOSPITAL_COMMUNITY): Payer: Self-pay

## 2016-04-09 ENCOUNTER — Encounter (HOSPITAL_COMMUNITY)
Admission: RE | Admit: 2016-04-09 | Discharge: 2016-04-09 | Disposition: A | Discharge status: Home / Self Care | Payer: Medicare Other | Source: Ambulatory Visit | Attending: Surgery | Admitting: Surgery

## 2016-04-09 DIAGNOSIS — Z0183 Encounter for blood typing: Secondary | ICD-10-CM | POA: Diagnosis not present

## 2016-04-09 DIAGNOSIS — Z01812 Encounter for preprocedural laboratory examination: Secondary | ICD-10-CM | POA: Insufficient documentation

## 2016-04-09 DIAGNOSIS — I714 Abdominal aortic aneurysm, without rupture: Secondary | ICD-10-CM | POA: Diagnosis not present

## 2016-04-09 HISTORY — DX: Malignant (primary) neoplasm, unspecified: C80.1

## 2016-04-09 LAB — COMPREHENSIVE METABOLIC PANEL
ALT: 13 U/L — AB (ref 17–63)
ANION GAP: 13 (ref 5–15)
AST: 18 U/L (ref 15–41)
Albumin: 3.9 g/dL (ref 3.5–5.0)
Alkaline Phosphatase: 56 U/L (ref 38–126)
BUN: 21 mg/dL — ABNORMAL HIGH (ref 6–20)
CHLORIDE: 110 mmol/L (ref 101–111)
CO2: 18 mmol/L — AB (ref 22–32)
Calcium: 9.2 mg/dL (ref 8.9–10.3)
Creatinine, Ser: 1.02 mg/dL (ref 0.61–1.24)
Glucose, Bld: 93 mg/dL (ref 65–99)
POTASSIUM: 4.3 mmol/L (ref 3.5–5.1)
SODIUM: 141 mmol/L (ref 135–145)
Total Bilirubin: 0.5 mg/dL (ref 0.3–1.2)
Total Protein: 6.5 g/dL (ref 6.5–8.1)

## 2016-04-09 LAB — CBC
HCT: 36.7 % — ABNORMAL LOW (ref 39.0–52.0)
Hemoglobin: 12.2 g/dL — ABNORMAL LOW (ref 13.0–17.0)
MCH: 28.2 pg (ref 26.0–34.0)
MCHC: 33.2 g/dL (ref 30.0–36.0)
MCV: 84.8 fL (ref 78.0–100.0)
PLATELETS: 227 10*3/uL (ref 150–400)
RBC: 4.33 MIL/uL (ref 4.22–5.81)
RDW: 15.1 % (ref 11.5–15.5)
WBC: 7.6 10*3/uL (ref 4.0–10.5)

## 2016-04-09 LAB — APTT: APTT: 31 s (ref 24–37)

## 2016-04-09 LAB — BLOOD GAS, ARTERIAL
ACID-BASE DEFICIT: 3.4 mmol/L — AB (ref 0.0–2.0)
BICARBONATE: 20.1 meq/L (ref 20.0–24.0)
DRAWN BY: 42180
FIO2: 0.21
O2 SAT: 95.2 %
PH ART: 7.439 (ref 7.350–7.450)
Patient temperature: 98.6
TCO2: 21 mmol/L (ref 0–100)
pCO2 arterial: 30.1 mmHg — ABNORMAL LOW (ref 35.0–45.0)
pO2, Arterial: 78.5 mmHg — ABNORMAL LOW (ref 80.0–100.0)

## 2016-04-09 LAB — URINALYSIS, ROUTINE W REFLEX MICROSCOPIC
BILIRUBIN URINE: NEGATIVE
GLUCOSE, UA: NEGATIVE mg/dL
HGB URINE DIPSTICK: NEGATIVE
KETONES UR: NEGATIVE mg/dL
Leukocytes, UA: NEGATIVE
Nitrite: NEGATIVE
PH: 5.5 (ref 5.0–8.0)
PROTEIN: NEGATIVE mg/dL
Specific Gravity, Urine: 1.011 (ref 1.005–1.030)

## 2016-04-09 LAB — TYPE AND SCREEN
ABO/RH(D): A POS
ANTIBODY SCREEN: NEGATIVE

## 2016-04-09 LAB — SURGICAL PCR SCREEN
MRSA, PCR: NEGATIVE
Staphylococcus aureus: NEGATIVE

## 2016-04-09 LAB — GLUCOSE, CAPILLARY: Glucose-Capillary: 98 mg/dL (ref 65–99)

## 2016-04-09 LAB — ABO/RH: ABO/RH(D): A POS

## 2016-04-09 LAB — PROTIME-INR
INR: 1.09 (ref 0.00–1.49)
PROTHROMBIN TIME: 14.3 s (ref 11.6–15.2)

## 2016-04-09 MED ORDER — CHLORHEXIDINE GLUCONATE CLOTH 2 % EX PADS: 6.0000 | MEDICATED_PAD | Freq: Once | CUTANEOUS | Status: DC

## 2016-04-09 NOTE — Pre-Procedure Instructions (Signed)
    Dillon Wagner  04/09/2016      [facility] [address] [place],  - [address] [address] [address] [address] [place] [address] Phone: [phone] Fax: [place], [place] [address] Phone: [phone] Fax: [phone]    Your procedure is scheduled on 04/17/16.  Report to [facility] Admitting at 1140 A.M.  Call this number if you have problems the morning of surgery:  [phone]   Remember:  Do not eat food or drink liquids after midnight.  Take these medicines the morning of surgery with A SIP OF WATER --diltiazem,neurontin,synthroid,metoprolol,prilosec,flomax   Do not wear jewelry, make-up or nail polish.  Do not wear lotions, powders, or perfumes.  You may wear deodorant.  Do not shave 48 hours prior to surgery.  Men may shave face and neck.  Do not bring valuables to the hospital.  [facility] is not responsible for any belongings or valuables.  Contacts, dentures or bridgework may not be worn into surgery.  Leave your suitcase in the car.  After surgery it may be brought to your room.  For patients admitted to the hospital, discharge time will be determined by your treatment team.  Patients discharged the day of surgery will not be allowed to drive home.   Name and phone number of your driver:   Special instructions:  Please read over the following fact sheets that you were given. Pain Booklet, Coughing and Deep Breathing, Blood Transfusion Information, MRSA Information and Surgical Site Infection Prevention

## 2016-04-10 LAB — HEMOGLOBIN A1C
Hgb A1c MFr Bld: 6.6 % — ABNORMAL HIGH (ref 4.8–5.6)
Mean Plasma Glucose: 143 mg/dL

## 2016-04-17 ENCOUNTER — Inpatient Hospital Stay (HOSPITAL_COMMUNITY): Payer: Medicare Other | Admitting: Certified Registered"

## 2016-04-17 ENCOUNTER — Inpatient Hospital Stay (HOSPITAL_COMMUNITY)
Admission: RE | Admit: 2016-04-17 | Discharge: 2016-04-18 | DRG: 269 | Disposition: A | Discharge status: Home / Self Care | Payer: Medicare Other | Source: Ambulatory Visit | Attending: Surgery | Admitting: Surgery

## 2016-04-17 ENCOUNTER — Other Ambulatory Visit: Payer: Self-pay | Admitting: *Deleted

## 2016-04-17 ENCOUNTER — Inpatient Hospital Stay (HOSPITAL_COMMUNITY): Payer: Medicare Other

## 2016-04-17 ENCOUNTER — Encounter (HOSPITAL_COMMUNITY)
Admission: RE | Disposition: A | Discharge status: Home / Self Care | Payer: Self-pay | Source: Ambulatory Visit | Attending: Surgery

## 2016-04-17 ENCOUNTER — Encounter (HOSPITAL_COMMUNITY): Payer: Self-pay | Admitting: *Deleted

## 2016-04-17 DIAGNOSIS — E0590 Thyrotoxicosis, unspecified without thyrotoxic crisis or storm: Secondary | ICD-10-CM | POA: Diagnosis present

## 2016-04-17 DIAGNOSIS — Z9889 Other specified postprocedural states: Secondary | ICD-10-CM

## 2016-04-17 DIAGNOSIS — I739 Peripheral vascular disease, unspecified: Secondary | ICD-10-CM | POA: Diagnosis present

## 2016-04-17 DIAGNOSIS — Z79899 Other long term (current) drug therapy: Secondary | ICD-10-CM

## 2016-04-17 DIAGNOSIS — Z885 Allergy status to narcotic agent status: Secondary | ICD-10-CM | POA: Diagnosis not present

## 2016-04-17 DIAGNOSIS — E7800 Pure hypercholesterolemia, unspecified: Secondary | ICD-10-CM | POA: Diagnosis present

## 2016-04-17 DIAGNOSIS — Z7982 Long term (current) use of aspirin: Secondary | ICD-10-CM | POA: Diagnosis not present

## 2016-04-17 DIAGNOSIS — J449 Chronic obstructive pulmonary disease, unspecified: Secondary | ICD-10-CM | POA: Diagnosis present

## 2016-04-17 DIAGNOSIS — Z7984 Long term (current) use of oral hypoglycemic drugs: Secondary | ICD-10-CM | POA: Diagnosis not present

## 2016-04-17 DIAGNOSIS — E119 Type 2 diabetes mellitus without complications: Secondary | ICD-10-CM | POA: Diagnosis present

## 2016-04-17 DIAGNOSIS — I10 Essential (primary) hypertension: Secondary | ICD-10-CM | POA: Diagnosis present

## 2016-04-17 DIAGNOSIS — E785 Hyperlipidemia, unspecified: Secondary | ICD-10-CM | POA: Diagnosis present

## 2016-04-17 DIAGNOSIS — Z95828 Presence of other vascular implants and grafts: Secondary | ICD-10-CM

## 2016-04-17 DIAGNOSIS — I714 Abdominal aortic aneurysm, without rupture, unspecified: Secondary | ICD-10-CM

## 2016-04-17 DIAGNOSIS — Z87891 Personal history of nicotine dependence: Secondary | ICD-10-CM

## 2016-04-17 DIAGNOSIS — Z8679 Personal history of other diseases of the circulatory system: Secondary | ICD-10-CM

## 2016-04-17 HISTORY — PX: ABDOMINAL AORTIC ENDOVASCULAR STENT GRAFT: SHX5707

## 2016-04-17 LAB — CBC
HCT: 31.2 % — ABNORMAL LOW (ref 39.0–52.0)
Hemoglobin: 10.1 g/dL — ABNORMAL LOW (ref 13.0–17.0)
MCH: 27.5 pg (ref 26.0–34.0)
MCHC: 32.4 g/dL (ref 30.0–36.0)
MCV: 85 fL (ref 78.0–100.0)
PLATELETS: 195 10*3/uL (ref 150–400)
RBC: 3.67 MIL/uL — ABNORMAL LOW (ref 4.22–5.81)
RDW: 15 % (ref 11.5–15.5)
WBC: 6.2 10*3/uL (ref 4.0–10.5)

## 2016-04-17 LAB — BASIC METABOLIC PANEL
Anion gap: 8 (ref 5–15)
BUN: 13 mg/dL (ref 6–20)
CHLORIDE: 113 mmol/L — AB (ref 101–111)
CO2: 20 mmol/L — ABNORMAL LOW (ref 22–32)
CREATININE: 0.85 mg/dL (ref 0.61–1.24)
Calcium: 8.1 mg/dL — ABNORMAL LOW (ref 8.9–10.3)
GFR calc Af Amer: >60 mL/min (ref >60)
Glucose, Bld: 111 mg/dL — ABNORMAL HIGH (ref 65–99)
Potassium: 3.8 mmol/L (ref 3.5–5.1)
SODIUM: 141 mmol/L (ref 135–145)

## 2016-04-17 LAB — GLUCOSE, CAPILLARY
GLUCOSE-CAPILLARY: 97 mg/dL (ref 65–99)
GLUCOSE-CAPILLARY: 147 mg/dL — AB (ref 65–99)
Glucose-Capillary: 127 mg/dL — ABNORMAL HIGH (ref 65–99)

## 2016-04-17 LAB — APTT: aPTT: 34 seconds (ref 24–37)

## 2016-04-17 LAB — MAGNESIUM: Magnesium: 1.5 mg/dL — ABNORMAL LOW (ref 1.7–2.4)

## 2016-04-17 LAB — PROTIME-INR
INR: 1.27 (ref 0.00–1.49)
PROTHROMBIN TIME: 16.1 s — AB (ref 11.6–15.2)

## 2016-04-17 SURGERY — INSERTION, ENDOVASCULAR STENT GRAFT, AORTA, ABDOMINAL
Anesthesia: General | Site: Abdomen

## 2016-04-17 MED ORDER — ONDANSETRON HCL 4 MG/2ML IJ SOLN
INTRAMUSCULAR | Status: AC
  Filled 2016-04-17: qty 2

## 2016-04-17 MED ORDER — ALUM & MAG HYDROXIDE-SIMETH 200-200-20 MG/5ML PO SUSP: 15.0000 mL | ORAL | Status: DC | PRN

## 2016-04-17 MED ORDER — SUGAMMADEX SODIUM 200 MG/2ML IV SOLN
INTRAVENOUS | Status: DC | PRN
  Administered 2016-04-17: 172 mg via INTRAVENOUS

## 2016-04-17 MED ORDER — SODIUM CHLORIDE 0.9 % IV SOLN: 500.0000 mL | Freq: Once | INTRAVENOUS | Status: DC | PRN

## 2016-04-17 MED ORDER — PHENOL 1.4 % MT LIQD: 1.0000 | OROMUCOSAL | Status: DC | PRN

## 2016-04-17 MED ORDER — DEXTROSE 5 % IV SOLN
10.0000 mg | INTRAVENOUS | Status: DC | PRN
  Administered 2016-04-17: 20 ug/min via INTRAVENOUS

## 2016-04-17 MED ORDER — MIDAZOLAM HCL 2 MG/2ML IJ SOLN: 0.5000 mg | Freq: Once | INTRAMUSCULAR | Status: DC | PRN

## 2016-04-17 MED ORDER — SODIUM CHLORIDE 0.9 % IV SOLN: INTRAVENOUS | Status: DC

## 2016-04-17 MED ORDER — LACTATED RINGERS IV SOLN
INTRAVENOUS | Status: DC | PRN
  Administered 2016-04-17 (×2): via INTRAVENOUS

## 2016-04-17 MED ORDER — LIDOCAINE HCL (CARDIAC) 20 MG/ML IV SOLN
INTRAVENOUS | Status: AC
  Filled 2016-04-17: qty 5

## 2016-04-17 MED ORDER — EPHEDRINE SULFATE 50 MG/ML IJ SOLN
INTRAMUSCULAR | Status: DC | PRN
  Administered 2016-04-17: 5 mg via INTRAVENOUS

## 2016-04-17 MED ORDER — LACTATED RINGERS IV SOLN
INTRAVENOUS | Status: DC
  Administered 2016-04-17: 12:00:00 via INTRAVENOUS

## 2016-04-17 MED ORDER — ACETAMINOPHEN 325 MG PO TABS
325.0000 mg | ORAL_TABLET | ORAL | Status: DC | PRN
  Administered 2016-04-17: 650 mg via ORAL
  Filled 2016-04-17: qty 2

## 2016-04-17 MED ORDER — PROPOFOL 10 MG/ML IV BOLUS
INTRAVENOUS | Status: AC
  Filled 2016-04-17: qty 20

## 2016-04-17 MED ORDER — PROPOFOL 10 MG/ML IV BOLUS
INTRAVENOUS | Status: DC | PRN
  Administered 2016-04-17: 120 mg via INTRAVENOUS

## 2016-04-17 MED ORDER — SIMVASTATIN 20 MG PO TABS
20.0000 mg | ORAL_TABLET | Freq: Every day | ORAL | Status: DC
  Administered 2016-04-17 – 2016-04-18 (×2): 20 mg via ORAL
  Filled 2016-04-17 (×2): qty 1

## 2016-04-17 MED ORDER — INSULIN ASPART 100 UNIT/ML ~~LOC~~ SOLN
0.0000 [IU] | Freq: Three times a day (TID) | SUBCUTANEOUS | Status: DC
  Administered 2016-04-18: 2 [IU] via SUBCUTANEOUS

## 2016-04-17 MED ORDER — FENTANYL CITRATE (PF) 100 MCG/2ML IJ SOLN
25.0000 ug | INTRAMUSCULAR | Status: DC | PRN
  Administered 2016-04-17 (×3): 25 ug via INTRAVENOUS

## 2016-04-17 MED ORDER — POTASSIUM CHLORIDE CRYS ER 20 MEQ PO TBCR
20.0000 meq | EXTENDED_RELEASE_TABLET | Freq: Every day | ORAL | Status: DC | PRN
Start: 2016-04-17 — End: 2016-04-18

## 2016-04-17 MED ORDER — GLIPIZIDE 5 MG PO TABS
5.0000 mg | ORAL_TABLET | Freq: Two times a day (BID) | ORAL | Status: DC
  Administered 2016-04-18: 5 mg via ORAL
  Filled 2016-04-17: qty 1

## 2016-04-17 MED ORDER — DEXTROSE 5 % IV SOLN
1.5000 g | Freq: Two times a day (BID) | INTRAVENOUS | Status: DC
  Administered 2016-04-17: 1.5 g via INTRAVENOUS
  Filled 2016-04-17 (×3): qty 1.5

## 2016-04-17 MED ORDER — ESMOLOL HCL 100 MG/10ML IV SOLN
INTRAVENOUS | Status: DC | PRN
  Administered 2016-04-17: 20 mg via INTRAVENOUS

## 2016-04-17 MED ORDER — MORPHINE SULFATE (PF) 2 MG/ML IV SOLN: 1.0000 mg | INTRAVENOUS | Status: DC | PRN

## 2016-04-17 MED ORDER — ENOXAPARIN SODIUM 40 MG/0.4ML ~~LOC~~ SOLN: 40.0000 mg | SUBCUTANEOUS | Status: DC

## 2016-04-17 MED ORDER — PANTOPRAZOLE SODIUM 40 MG PO TBEC
40.0000 mg | DELAYED_RELEASE_TABLET | Freq: Every day | ORAL | Status: DC
  Administered 2016-04-18: 40 mg via ORAL
  Filled 2016-04-17: qty 1

## 2016-04-17 MED ORDER — CEFUROXIME SODIUM 1.5 G IJ SOLR
1.5000 g | INTRAMUSCULAR | Status: AC
  Administered 2016-04-17: 1.5 g via INTRAVENOUS
  Filled 2016-04-17: qty 1.5

## 2016-04-17 MED ORDER — DOCUSATE SODIUM 100 MG PO CAPS
100.0000 mg | ORAL_CAPSULE | Freq: Every day | ORAL | Status: DC
  Administered 2016-04-18: 100 mg via ORAL
  Filled 2016-04-17: qty 1

## 2016-04-17 MED ORDER — LEVOTHYROXINE SODIUM 50 MCG PO TABS
50.0000 ug | ORAL_TABLET | Freq: Every day | ORAL | Status: DC
  Administered 2016-04-18: 50 ug via ORAL
  Filled 2016-04-17: qty 1

## 2016-04-17 MED ORDER — PROTAMINE SULFATE 10 MG/ML IV SOLN
INTRAVENOUS | Status: DC | PRN
  Administered 2016-04-17: 50 mg via INTRAVENOUS

## 2016-04-17 MED ORDER — METOPROLOL SUCCINATE ER 50 MG PO TB24
100.0000 mg | ORAL_TABLET | Freq: Every day | ORAL | Status: DC
  Administered 2016-04-18: 100 mg via ORAL
  Filled 2016-04-17: qty 2

## 2016-04-17 MED ORDER — SODIUM CHLORIDE 0.9 % IV SOLN
INTRAVENOUS | Status: DC | PRN
  Administered 2016-04-17: 500 mL

## 2016-04-17 MED ORDER — ONDANSETRON HCL 4 MG/2ML IJ SOLN
4.0000 mg | Freq: Four times a day (QID) | INTRAMUSCULAR | Status: DC | PRN
  Administered 2016-04-17: 4 mg via INTRAVENOUS

## 2016-04-17 MED ORDER — ROCURONIUM BROMIDE 50 MG/5ML IV SOLN
INTRAVENOUS | Status: AC
  Filled 2016-04-17: qty 1

## 2016-04-17 MED ORDER — TAMSULOSIN HCL 0.4 MG PO CAPS
0.4000 mg | ORAL_CAPSULE | Freq: Every day | ORAL | Status: DC
  Administered 2016-04-18: 0.4 mg via ORAL
  Filled 2016-04-17: qty 1

## 2016-04-17 MED ORDER — GABAPENTIN 600 MG PO TABS
600.0000 mg | ORAL_TABLET | Freq: Two times a day (BID) | ORAL | Status: DC
  Administered 2016-04-17 – 2016-04-18 (×2): 600 mg via ORAL
  Filled 2016-04-17 (×2): qty 1

## 2016-04-17 MED ORDER — HEPARIN SODIUM (PORCINE) 1000 UNIT/ML IJ SOLN
INTRAMUSCULAR | Status: DC | PRN
  Administered 2016-04-17: 8 mL via INTRAVENOUS

## 2016-04-17 MED ORDER — METFORMIN HCL 1000 MG PO TABS: 1000.0000 mg | ORAL_TABLET | Freq: Two times a day (BID) | ORAL | Status: DC

## 2016-04-17 MED ORDER — OXYCODONE-ACETAMINOPHEN 5-325 MG PO TABS
1.0000 | ORAL_TABLET | ORAL | Status: DC | PRN
Start: 2016-04-17 — End: 2016-04-18

## 2016-04-17 MED ORDER — ROCURONIUM BROMIDE 100 MG/10ML IV SOLN
INTRAVENOUS | Status: DC | PRN
Start: 2016-04-17 — End: 2016-04-17
  Administered 2016-04-17: 40 mg via INTRAVENOUS

## 2016-04-17 MED ORDER — SODIUM CHLORIDE 0.45 % IV SOLN
INTRAVENOUS | Status: DC
  Administered 2016-04-17: 20:00:00 via INTRAVENOUS

## 2016-04-17 MED ORDER — MEPERIDINE HCL 25 MG/ML IJ SOLN: 6.2500 mg | INTRAMUSCULAR | Status: DC | PRN

## 2016-04-17 MED ORDER — GUAIFENESIN-DM 100-10 MG/5ML PO SYRP: 15.0000 mL | ORAL_SOLUTION | ORAL | Status: DC | PRN

## 2016-04-17 MED ORDER — LABETALOL HCL 5 MG/ML IV SOLN
10.0000 mg | INTRAVENOUS | Status: DC | PRN
  Filled 2016-04-17: qty 4

## 2016-04-17 MED ORDER — IODIXANOL 320 MG/ML IV SOLN
INTRAVENOUS | Status: DC | PRN
  Administered 2016-04-17: 80.4 mL via INTRAVENOUS

## 2016-04-17 MED ORDER — FENTANYL CITRATE (PF) 100 MCG/2ML IJ SOLN
INTRAMUSCULAR | Status: DC | PRN
  Administered 2016-04-17: 100 ug via INTRAVENOUS
  Administered 2016-04-17: 50 ug via INTRAVENOUS

## 2016-04-17 MED ORDER — FENTANYL CITRATE (PF) 100 MCG/2ML IJ SOLN
INTRAMUSCULAR | Status: AC
  Filled 2016-04-17: qty 2

## 2016-04-17 MED ORDER — KETOCONAZOLE 2 % EX CREA: 1.0000 "application " | TOPICAL_CREAM | Freq: Two times a day (BID) | CUTANEOUS | Status: DC

## 2016-04-17 MED ORDER — METOPROLOL TARTRATE 1 MG/ML IV SOLN: 2.0000 mg | INTRAVENOUS | Status: DC | PRN

## 2016-04-17 MED ORDER — OXYCODONE-ACETAMINOPHEN 5-325 MG PO TABS: 1.0000 | ORAL_TABLET | Freq: Four times a day (QID) | ORAL | Status: DC | PRN

## 2016-04-17 MED ORDER — 0.9 % SODIUM CHLORIDE (POUR BTL) OPTIME
TOPICAL | Status: DC | PRN
  Administered 2016-04-17: 1000 mL

## 2016-04-17 MED ORDER — HYDRALAZINE HCL 20 MG/ML IJ SOLN: 5.0000 mg | INTRAMUSCULAR | Status: DC | PRN

## 2016-04-17 MED ORDER — FENTANYL CITRATE (PF) 250 MCG/5ML IJ SOLN
INTRAMUSCULAR | Status: AC
  Filled 2016-04-17: qty 5

## 2016-04-17 MED ORDER — ONDANSETRON HCL 4 MG/2ML IJ SOLN
INTRAMUSCULAR | Status: DC | PRN
Start: 2016-04-17 — End: 2016-04-17
  Administered 2016-04-17: 4 mg via INTRAVENOUS

## 2016-04-17 MED ORDER — DILTIAZEM HCL ER COATED BEADS 240 MG PO CP24
240.0000 mg | ORAL_CAPSULE | Freq: Every day | ORAL | Status: DC
  Filled 2016-04-17: qty 1

## 2016-04-17 MED ORDER — ASPIRIN EC 81 MG PO TBEC
81.0000 mg | DELAYED_RELEASE_TABLET | Freq: Every day | ORAL | Status: DC
Start: 2016-04-17 — End: 2016-04-18
  Administered 2016-04-17 – 2016-04-18 (×2): 81 mg via ORAL
  Filled 2016-04-17 (×2): qty 1

## 2016-04-17 MED ORDER — MAGNESIUM SULFATE 2 GM/50ML IV SOLN: 2.0000 g | Freq: Every day | INTRAVENOUS | Status: DC | PRN

## 2016-04-17 MED ORDER — ACETAMINOPHEN 650 MG RE SUPP: 325.0000 mg | RECTAL | Status: DC | PRN

## 2016-04-17 MED ORDER — LORATADINE 10 MG PO TABS
10.0000 mg | ORAL_TABLET | Freq: Every day | ORAL | Status: DC
  Administered 2016-04-17 – 2016-04-18 (×2): 10 mg via ORAL
  Filled 2016-04-17 (×2): qty 1

## 2016-04-17 MED ORDER — SUGAMMADEX SODIUM 500 MG/5ML IV SOLN
INTRAVENOUS | Status: AC
  Filled 2016-04-17: qty 5

## 2016-04-17 SURGICAL SUPPLY — 67 items
BLADE SURG CLIPPER 3M 9600 (MISCELLANEOUS) ×3 IMPLANT
CANISTER SUCTION 2500CC (MISCELLANEOUS) ×3 IMPLANT
CATH ANGIO 5F BER2 65CM (CATHETERS) ×3 IMPLANT
CATH BEACON 5.038 65CM KMP-01 (CATHETERS) IMPLANT
CATH OMNI FLUSH .035X70CM (CATHETERS) ×3 IMPLANT
COVER PROBE W GEL 5X96 (DRAPES) ×3 IMPLANT
DEVICE CLOSURE PERCLS PRGLD 6F (VASCULAR PRODUCTS) ×4 IMPLANT
DEVICE TORQUE H2O (MISCELLANEOUS) ×3 IMPLANT
DRAPE ZERO GRAVITY STERILE (DRAPES) ×3 IMPLANT
DRSG TEGADERM 2-3/8X2-3/4 SM (GAUZE/BANDAGES/DRESSINGS) ×3 IMPLANT
DRYSEAL FLEXSHEATH 12FR 33CM (SHEATH) ×2
DRYSEAL FLEXSHEATH 16FR 33CM (SHEATH) ×2
ELECT CAUTERY BLADE 6.4 (BLADE) ×3 IMPLANT
ELECT REM PT RETURN 9FT ADLT (ELECTROSURGICAL) ×6
ELECTRODE REM PT RTRN 9FT ADLT (ELECTROSURGICAL) ×2 IMPLANT
EXCLUDER TNK LEG 26MX14X14 (Endovascular Graft) ×1 IMPLANT
EXCLUDER TRUNK LEG 26MX14X14 (Endovascular Graft) ×3 IMPLANT
GAUZE SPONGE 2X2 8PLY STRL LF (GAUZE/BANDAGES/DRESSINGS) ×1 IMPLANT
GLOVE BIO SURGEON STRL SZ 6.5 (GLOVE) ×2 IMPLANT
GLOVE BIO SURGEONS STRL SZ 6.5 (GLOVE) ×1
GLOVE BIOGEL PI IND STRL 6.5 (GLOVE) ×1 IMPLANT
GLOVE BIOGEL PI IND STRL 7.0 (GLOVE) ×1 IMPLANT
GLOVE BIOGEL PI IND STRL 7.5 (GLOVE) ×1 IMPLANT
GLOVE BIOGEL PI INDICATOR 6.5 (GLOVE) ×2
GLOVE BIOGEL PI INDICATOR 7.0 (GLOVE) ×2
GLOVE BIOGEL PI INDICATOR 7.5 (GLOVE) ×2
GLOVE ECLIPSE 7.5 STRL STRAW (GLOVE) ×3 IMPLANT
GLOVE SURG SS PI 6.5 STRL IVOR (GLOVE) ×3 IMPLANT
GLOVE SURG SS PI 7.5 STRL IVOR (GLOVE) ×3 IMPLANT
GOWN STRL REUS W/ TWL LRG LVL3 (GOWN DISPOSABLE) ×2 IMPLANT
GOWN STRL REUS W/ TWL XL LVL3 (GOWN DISPOSABLE) ×1 IMPLANT
GOWN STRL REUS W/TWL LRG LVL3 (GOWN DISPOSABLE) ×4
GOWN STRL REUS W/TWL XL LVL3 (GOWN DISPOSABLE) ×2
GRAFT BALLN CATH 65CM (STENTS) ×1 IMPLANT
GRAFT EXCLUDER LEG 14.5X12 (Endovascular Graft) ×3 IMPLANT
GUIDEWIRE ANGLED .035X150CM (WIRE) ×3 IMPLANT
HEMOSTAT SNOW SURGICEL 2X4 (HEMOSTASIS) IMPLANT
KIT BASIN OR (CUSTOM PROCEDURE TRAY) ×3 IMPLANT
KIT ROOM TURNOVER OR (KITS) ×3 IMPLANT
LIQUID BAND (GAUZE/BANDAGES/DRESSINGS) ×3 IMPLANT
NEEDLE PERC 18GX7CM (NEEDLE) ×3 IMPLANT
NS IRRIG 1000ML POUR BTL (IV SOLUTION) ×3 IMPLANT
PACK ENDOVASCULAR (PACKS) ×3 IMPLANT
PAD ARMBOARD 7.5X6 YLW CONV (MISCELLANEOUS) ×6 IMPLANT
PENCIL BUTTON HOLSTER BLD 10FT (ELECTRODE) ×3 IMPLANT
PERCLOSE PROGLIDE 6F (VASCULAR PRODUCTS) ×12
SHEATH AVANTI 11CM 8FR (MISCELLANEOUS) ×3 IMPLANT
SHEATH BRITE TIP 8FR 23CM (MISCELLANEOUS) ×3 IMPLANT
SHEATH DRYSEAL FLEX 12FR 33CM (SHEATH) ×1 IMPLANT
SHEATH DRYSEAL FLEX 16FR 33CM (SHEATH) ×1 IMPLANT
SHIELD RADPAD SCOOP 12X17 (MISCELLANEOUS) ×6 IMPLANT
SLEEVE SURGEON STRL (DRAPES) ×3 IMPLANT
SPONGE GAUZE 2X2 STER 10/PKG (GAUZE/BANDAGES/DRESSINGS) ×2
STENT GRAFT BALLN CATH 65CM (STENTS) ×2
STOPCOCK MORSE 400PSI 3WAY (MISCELLANEOUS) ×3 IMPLANT
SUT ETHILON 3 0 PS 1 (SUTURE) IMPLANT
SUT PROLENE 5 0 C 1 24 (SUTURE) IMPLANT
SUT VIC AB 2-0 CT1 27 (SUTURE)
SUT VIC AB 2-0 CT1 TAPERPNT 27 (SUTURE) IMPLANT
SUT VIC AB 3-0 SH 27 (SUTURE)
SUT VIC AB 3-0 SH 27X BRD (SUTURE) IMPLANT
SUT VICRYL 4-0 PS2 18IN ABS (SUTURE) ×6 IMPLANT
SYR 30ML LL (SYRINGE) ×3 IMPLANT
TRAY FOLEY W/METER SILVER 16FR (SET/KITS/TRAYS/PACK) ×3 IMPLANT
TUBING HIGH PRESSURE 120CM (CONNECTOR) ×3 IMPLANT
WIRE AMPLATZ SS-J .035X180CM (WIRE) ×9 IMPLANT
WIRE BENTSON .035X145CM (WIRE) ×6 IMPLANT

## 2016-04-17 NOTE — Anesthesia Preprocedure Evaluation (Addendum)
Anesthesia Evaluation  Patient identified by MRN, date of birth, ID band Patient awake    Reviewed: Allergy & Precautions, NPO status , Patient's Chart, lab work & pertinent test results, reviewed documented beta blocker date and time   History of Anesthesia Complications Negative for: history of anesthetic complications  Airway Mallampati: II  TM Distance: >3 FB Neck ROM: Full    Dental  (+) Dental Advisory Given, Partial Lower, Partial Upper   Pulmonary COPD, former smoker (quit 1987),    breath sounds clear to auscultation       Cardiovascular hypertension, Pt. on medications and Pt. on home beta blockers (-) angina+ Peripheral Vascular Disease (AAA)   Rhythm:Regular Rate:Normal  '13 myoview: normal   Neuro/Psych negative neurological ROS     GI/Hepatic Neg liver ROS, GERD  Medicated and Controlled,  Endo/Other  diabetes (glu 127), Oral Hypoglycemic AgentsHypothyroidism   Renal/GU negative Renal ROS     Musculoskeletal  (+) Arthritis , Osteoarthritis,    Abdominal   Peds  Hematology   Anesthesia Other Findings   Reproductive/Obstetrics                           Anesthesia Physical Anesthesia Plan  ASA: III  Anesthesia Plan: General   Post-op Pain Management:    Induction: Intravenous  Airway Management Planned: Oral ETT  Additional Equipment: Arterial line, CVP and Ultrasound Guidance Line Placement  Intra-op Plan:   Post-operative Plan: Extubation in OR  Informed Consent: I have reviewed the patients History and Physical, chart, labs and discussed the procedure including the risks, benefits and alternatives for the proposed anesthesia with the patient or authorized representative who has indicated his/her understanding and acceptance.   Dental advisory given  Plan Discussed with: CRNA and Surgeon  Anesthesia Plan Comments: (Plan routine monitors, A-line, CVP (difficult  IV access), GETA)        Anesthesia Quick Evaluation

## 2016-04-17 NOTE — Progress Notes (Signed)
  [facility] Day [facility] Note  Subjective:  Patient seen in PACU. Complaining of penile pain.  Filed Vitals:   04/17/16 1543 04/17/16 1600  BP: 129/69 141/71  Pulse: 64 58  Temp: 97.4 F (36.3 C)   Resp: 15 14   Groins are soft. No hematoma.  Palpable dorsalis pedis pulses bilaterally  No irritation or erythema around foley catheter.   Assessment/Plan:  This is a 75 y.o. male who is s/p EVAR  Plan d/c foley POD 1. Doing ok post-op.  To 3S when bed available.   Virgina Jock, [place] Pager: [phone] 04/17/2016 4:02 PM

## 2016-04-17 NOTE — Interval H&P Note (Signed)
History and Physical Interval Note:  04/17/2016 12:55 PM  Dillon Wagner  has presented today for surgery, with the diagnosis of Abdominal aortic anuerysm I71.4  The various methods of treatment have been discussed with the patient and family. After consideration of risks, benefits and other options for treatment, the patient has consented to  Procedure(s): ABDOMINAL AORTIC ENDOVASCULAR STENT GRAFT (N/A) as a surgical intervention .  The patient's history has been reviewed, patient examined, no change in status, stable for surgery.  I have reviewed the patient's chart and labs.  Questions were answered to the patient's satisfaction.     Annamarie Major

## 2016-04-17 NOTE — Anesthesia Procedure Notes (Addendum)
Central Venous Catheter Insertion Performed by: anesthesiologist 04/17/2016 1:35 PM Patient location: Pre-op. Preanesthetic checklist: patient identified, IV checked, site marked, risks and benefits discussed, surgical consent, monitors and equipment checked, pre-op evaluation and timeout performed Position: Trendelenburg Lidocaine 1% used for infiltration Landmarks identified and Seldinger technique used Central line was placed.Double lumen Procedure performed using ultrasound guided technique. Attempts: 1 Following insertion, Biopatch, dressing applied and line sutured. Post procedure assessment: blood return through all ports, free fluid flow and no air. Patient tolerated the procedure well with no immediate complications. Additional procedure comments: CVP: Timeout, sterile prep, drape, FBP R neck.  Trendelenburg position.  1% lido local, finder and trocar RIJ 1st pass with US guidance.  2 lumen placed over J wire. Biopatch and sterile dressing on.  Patient tolerated well.  VSS.  Jenita Seashore, MD.    Procedure Name: Intubation Date/Time: 04/17/2016 1:51 PM Performed by: Lance Coon Pre-anesthesia Checklist: Patient identified, Emergency Drugs available, Patient being monitored, Suction available and Timeout performed Patient Re-evaluated:Patient Re-evaluated prior to inductionOxygen Delivery Method: Circle system utilized Preoxygenation: Pre-oxygenation with 100% oxygen Intubation Type: IV induction Ventilation: Mask ventilation without difficulty Laryngoscope Size: Miller and 2 Grade View: Grade I Tube type: Oral Tube size: 7.5 mm Number of attempts: 1 Airway Equipment and Method: Stylet Secured at: 21 cm Tube secured with: Tape Dental Injury: Teeth and Oropharynx as per pre-operative assessment

## 2016-04-17 NOTE — Progress Notes (Signed)
Care of pt assumed by MA Itzae Miralles RN 

## 2016-04-17 NOTE — Op Note (Signed)
Patient name: DEVARIUS NELLES MRN: [id number] DOB: 10-Nov-1941 Sex: male  04/17/2016 Pre-operative Diagnosis: AAA Post-operative diagnosis:  Same Surgeon:  Annamarie Major Assistants:  Silva Bandy Procedure:   #1:Endovascular repair of abdominal aortic aneurysm   #2: Bilateral ultrasound-guided common femoral artery access   #3: Catheter in aorta 2   #4: Abdominal aortogram Anesthesia:  gen. Blood Loss:  See anesthesia record Specimens:  none  Findings:  Complete exclusion Devices used: Main body was primary right Gore 26 x 14 x 14.  Contralateral left 14 x 12 Indications:  The patient has been followed for a progressively enlarging infrarenal abdominal aortic aneurysm.  He has met size criteria for repair.  Risks and benefits were discussed at his preoperative visit  Procedure:  The patient was identified in the holding area and taken to [place] 16  The patient was then placed supine on the table. general anesthesia was administered.  The patient was prepped and draped in the usual sterile fashion.  A time out was called and antibiotics were administered.  Ultrasound was used to evaluate bilateral common femoral arteries.  There is widely patent without significant calcification.  A #11 blade was used to make a skin nick.  Bilateral common femoral arteries were then cannulated under ultrasound guidance with an 18-gauge needle.  A 035 wire was advanced without resistance.  A  20 French dilator was used to dilate the subcutaneous tissue.  A French sheaths were placed bilaterally.  The patient was fully heparinized.  Over a Amplatz superstiff wire, the sheath in the right groin was exchanged out for a 16 French sheath which was advanced into the abdominal aorta.  Next, a Omni flush catheter was advanced to the level of L-1 up the left side and an abdominal aortogram was performed locating the renal arteries.  The main body device was prepared on the back table.  This was a Gore 26 x 14 x 14 device.   It was advanced up the right side and deployed landing at the level of the lowest renal artery. Next, the contralateral gate was cannulated with a Berenstein 2 catheter and a Glidewire.  The Berenstein 2 catheter was advanced into the main body of the device.  The Glidewire was removed and a Amplatz superstiff wire was placed.  The Berenstein 2 catheter was exchanged out for a Omni flush catheter which was able to be freely rotated within the main body of the device, confirming successful cannulation.  Next, the image detector was rotated to a right anterior oblique position a retrograde injection through the sheath in the left groin was performed locating the left hypogastric artery.  The 8 French sheath in the left groin was exchanged out for a 12 [place] sheath which was advanced into the contralateral gate.  Contralateral limb was prepared on the back table.  This was a Gore 14 x 12 device.  It was advanced to the sheath and deployed landing just proximal to the left hypogastric artery.  Next the image detector was rotated to a left anterior oblique position.  A retrograde injection was performed through the sheath in the right groin which showed the location of the right hypogastric artery.  The ipsilateral limb was proximal to this.  The remaining portion of the ipsilateral limb was deployed.  Next, a Q-50 balloon was used to mold the proximal and distal attachment sites as well as device overlap.  A completion arteriogram was then performed which showed successful exclusion  of aneurysm with preserved patency of the bilateral renal arteries.  Stiff wires were exchanged out for a 035 Bentson wires.  Both sheaths were removed and the groins were closed by securing the previously placed probe glide devices.  50 mg of protamine was given.  Both groins were hemostatic and closed with 4-0 Vicryl and Dermabond. Patient had palpable pedal pulses at the end of the procedure.   Disposition:  To PACU in stable  condition.   Theotis Burrow, M.D. [facility] Office: [phone] Pager:  [phone]

## 2016-04-17 NOTE — H&P (View-Only) (Signed)
Patient name: Dillon Wagner MRN: [id number] DOB: 19-Aug-1941 Sex: male   Referred by: Dr. Gwenlyn Found  Reason for referral:  Chief Complaint  Patient presents with  . New Evaluation    AAA    HISTORY OF PRESENT ILLNESS: This is a 75 year old gentleman who comes in today for evaluation of an abdominal aortic aneurysm.  He has been followed with serial ultrasounds.  He recently went for a CT scan which showed a 5.9 cm aneurysm.  He denies any abdominal pain or back pain.  The patient suffers from diabetes.  He states his blood sugars are usually in the 120 range and his hemoglobin A1c was around 6.  He is medically managed for hypercholesterolemia with a statin.  His blood pressure has been controlled.  He is on aspirin.  He denies any significant cardiac issues  Past Medical History  Diagnosis Date  . AAA (abdominal aortic aneurysm) (HCC)     4.9 x 4.6 cm  . Arthritis   . Diabetes ([place])   . Hyperthyroidism   . Hypertension   . Hyperlipidemia   . History of stress test 05/04/2012    negative stress test  . S/P wrist surgery 09/11/1958    right wrist remove  He is a former smoker.  Past Surgical History  Procedure Laterality Date  . Shoulder surgery Left 10/31/2012  . Neck surgery  05/2003    Dr Consuello Masse  . Back surgery  2004  . Stomach sugery  1969    Infected fat on left side  . Appendectomy  1969  . Elbow surgery  1957    Pins  . Thumb surgery  01/1995  . Nm myocar perf wall motion  05/04/2012    protocol:Lexiscan, lung to heart ratio 38%, post EF 67%, small fixed apical defect    Social History   Social History  . Marital Status: Married    Spouse Name: N/A  . Number of Children: N/A  . Years of Education: N/A   Occupational History  . Not on file.   Social History Main Topics  . Smoking status: Former Smoker    Quit date: 04/28/1986  . Smokeless tobacco: Former Systems developer    Quit date: 03/22/1987  . Alcohol Use: No  . Drug Use: No  . Sexual Activity: Not  on file   Other Topics Concern  . Not on file   Social History Narrative    Family History  Problem Relation Age of Onset  . Cancer Mother 75    ovarian  . Cancer Father 74    cancer in the stomach    Allergies as of 03/21/2016 - Review Complete 03/21/2016  Allergen Reaction Noted  . Codeine      Current Outpatient Prescriptions on File Prior to Visit  Medication Sig Dispense Refill  . aspirin 81 MG tablet Take 81 mg by mouth daily.    . diclofenac (VOLTAREN) 75 MG EC tablet Take 75 mg by mouth daily.    Marland Kitchen diltiazem (DILACOR XR) 240 MG 24 hr capsule Take 240 mg by mouth daily.    Marland Kitchen gabapentin (NEURONTIN) 600 MG tablet Take 600 mg by mouth 2 (two) times daily.    Marland Kitchen glipiZIDE (GLUCOTROL) 5 MG tablet Take 5 mg by mouth 2 (two) times daily before a meal. Take 2.5mg  in the AM and 5mg  in the PM.    . levothyroxine (SYNTHROID, LEVOTHROID) 75 MCG tablet Take 50 mcg by mouth daily before breakfast.     .  loratadine (CLARITIN) 10 MG tablet Take 10 mg by mouth daily.    . metFORMIN (GLUCOPHAGE) 1000 MG tablet Take 1,000 mg by mouth 2 (two) times daily.    . metoprolol succinate (TOPROL-XL) 100 MG 24 hr tablet Take 100 mg by mouth daily. Take with or immediately following a meal.    . omeprazole (PRILOSEC) 20 MG capsule Take 20 mg by mouth 2 (two) times daily before a meal.     . simvastatin (ZOCOR) 20 MG tablet Take 20 mg by mouth daily.    . tamsulosin (FLOMAX) 0.4 MG CAPS Take 0.4 mg by mouth daily after breakfast.     No current facility-administered medications on file prior to visit.     REVIEW OF SYSTEMS: Cardiovascular: No chest pain, chest pressure, palpitations, orthopnea, or dyspnea on exertion. No claudication or rest pain,  No history of DVT or phlebitis. Pulmonary: No productive cough, asthma or wheezing. Neurologic: No weakness, paresthesias, aphasia, or amaurosis. No dizziness. Hematologic: No bleeding problems or clotting disorders. Musculoskeletal:Right wrist  amputation. Gastrointestinal: No blood in stool or hematemesis Genitourinary: No dysuria or hematuria. Psychiatric:: No history of major depression. Integumentary: No rashes or ulcers. Constitutional: No fever or chills.  PHYSICAL EXAMINATION:  Filed Vitals:   03/21/16 1434  BP: 113/65  Pulse: 68  Temp: 97.3 F (36.3 C)  Resp: 18  Height: 5\' 10"  (1.778 m)  Weight: 188 lb (85.276 kg)  SpO2: 97%   Body mass index is 26.98 kg/(m^2). General: The patient appears their stated age.   HEENT:  No gross abnormalities Pulmonary: Respirations are non-labored Abdomen: Soft and non-tender Aorta is easily palpable and nontender Musculoskeletal: There are no major deformities.   Neurologic: No focal weakness or paresthesias are detected, Skin: There are no ulcer or rashes noted. Psychiatric: The patient has normal affect. Cardiovascular: There is a regular rate and rhythm without significant murmur appreciated.  No carotid bruits.  Palpable pedal pulses.  Diagnostic Studies: I have reviewed his CT and exam which shows a 5.9 cm infrarenal abdominal aortic aneurysm    Assessment:  Abdominal aortic aneurysm Plan: I discussed our treatment options with the patient.  We have decided to proceed with endovascular repair.  I discussed the risks and benefits including but not limited to the risk of cardiopulmonary complications, intestinal ischemia, lower extremity ischemia, bleeding, renal dysfunction.  All his questions were answered and he wishes to proceed.  We also discussed possibility of an endoleak and the need for future interventions as well as follow-up surveillance.  His procedure has tentatively been scheduled for Thursday, April 20.  I will get preoperative carotid Doppler studies prior to this.     Eldridge Abrahams, M.D. [facility] Office: [phone] Pager:  [phone]

## 2016-04-17 NOTE — Transfer of Care (Signed)
Immediate Anesthesia Transfer of Care Note  Patient: Dillon Wagner  Procedure(s) Performed: Procedure(s): ABDOMINAL AORTIC ENDOVASCULAR STENT GRAFT (N/A)  Patient Location: PACU  Anesthesia Type:General  Level of Consciousness: awake, alert , oriented and patient cooperative  Airway & Oxygen Therapy: Patient Spontanous Breathing and Patient connected to nasal cannula oxygen  Post-op Assessment: Report given to RN, Post -op Vital signs reviewed and stable and Patient moving all extremities X 4  Post vital signs: Reviewed and stable  Last Vitals:  Filed Vitals:   04/17/16 1141  BP: 121/57  Pulse: 54  Temp: 36.8 C  Resp: 20    Complications: No apparent anesthesia complications

## 2016-04-17 NOTE — Anesthesia Postprocedure Evaluation (Signed)
Anesthesia Post Note  Patient: Dillon Wagner  Procedure(s) Performed: Procedure(s) (LRB): ABDOMINAL AORTIC ENDOVASCULAR STENT GRAFT (N/A)  Patient location during evaluation: PACU Anesthesia Type: General Level of consciousness: awake and alert Pain management: pain level controlled Vital Signs Assessment: post-procedure vital signs reviewed and stable Respiratory status: spontaneous breathing, nonlabored ventilation, respiratory function stable and patient connected to nasal cannula oxygen Cardiovascular status: blood pressure returned to baseline and stable Postop Assessment: no signs of nausea or vomiting Anesthetic complications: no    Last Vitals:  Filed Vitals:   04/17/16 1715 04/17/16 1730  BP:    Pulse: 59 62  Temp:    Resp: 14 16    Last Pain:  Filed Vitals:   04/17/16 1737  PainSc: Asleep                 Venda Dice,W. EDMOND

## 2016-04-18 ENCOUNTER — Encounter (HOSPITAL_COMMUNITY): Payer: Self-pay | Admitting: Surgery

## 2016-04-18 ENCOUNTER — Telehealth: Payer: Self-pay | Admitting: Surgery

## 2016-04-18 LAB — BASIC METABOLIC PANEL
ANION GAP: 10 (ref 5–15)
BUN: 10 mg/dL (ref 6–20)
CHLORIDE: 109 mmol/L (ref 101–111)
CO2: 23 mmol/L (ref 22–32)
Calcium: 8.5 mg/dL — ABNORMAL LOW (ref 8.9–10.3)
Creatinine, Ser: 0.93 mg/dL (ref 0.61–1.24)
GFR calc non Af Amer: >60 mL/min (ref >60)
Glucose, Bld: 118 mg/dL — ABNORMAL HIGH (ref 65–99)
POTASSIUM: 4.2 mmol/L (ref 3.5–5.1)
Sodium: 142 mmol/L (ref 135–145)

## 2016-04-18 LAB — CBC
HEMATOCRIT: 32.9 % — AB (ref 39.0–52.0)
HEMOGLOBIN: 10.7 g/dL — AB (ref 13.0–17.0)
MCH: 28 pg (ref 26.0–34.0)
MCHC: 32.5 g/dL (ref 30.0–36.0)
MCV: 86.1 fL (ref 78.0–100.0)
Platelets: 198 10*3/uL (ref 150–400)
RBC: 3.82 MIL/uL — AB (ref 4.22–5.81)
RDW: 15.2 % (ref 11.5–15.5)
WBC: 7.6 10*3/uL (ref 4.0–10.5)

## 2016-04-18 LAB — GLUCOSE, CAPILLARY: Glucose-Capillary: 125 mg/dL — ABNORMAL HIGH (ref 65–99)

## 2016-04-18 MED ORDER — DILTIAZEM HCL ER COATED BEADS 120 MG PO CP24
240.0000 mg | ORAL_CAPSULE | Freq: Every day | ORAL | Status: DC
  Administered 2016-04-18: 240 mg via ORAL
  Filled 2016-04-18: qty 2

## 2016-04-18 NOTE — Progress Notes (Signed)
Art line removed without any difficulties. Pt tolerated well.

## 2016-04-18 NOTE — Progress Notes (Signed)
Discharge note. Educated patient and patients wife at bedside. Reviewed medications and discharge instructions including follow up appointment, activity restrictions and all physician's instructions. Peripheral IV removed. Pt. Anxious to discharge. Discharged home via wheelchair by nurse tech. Leonie Man, RN

## 2016-04-18 NOTE — Telephone Encounter (Signed)
sched appt 5/22, cta at [place] [address] at 11:30 and md at 1:15. Spoke to pt to inform him of appt.

## 2016-04-18 NOTE — Telephone Encounter (Signed)
-----   Message from Mena Goes, RN sent at 04/17/2016  3:36 PM EDT ----- Regarding: schedule   ----- Message -----    From: Alvia Grove, PA-C    Sent: 04/17/2016   3:26 PM      To: Vvs Charge Pool  S/p EVAR 04/17/16  F/u with Dr. Trula Slade in 4 weeks with CTA  Thanks Maudie Mercury

## 2016-04-18 NOTE — Progress Notes (Signed)
[facility]  Subjective  - Doing very well.  He has voided.   Objective 115/57 69 98.7 F (37.1 C) (Oral) 18 96%  Intake/Output Summary (Last 24 hours) at 04/18/16 0714 Last data filed at 04/18/16 0645  Gross per 24 hour  Intake   2525 ml  Output   2925 ml  Net   -400 ml    Palpable DP 3+ bilaterally Groins soft without hematoma Heart RRR Lungs non labored breathing  Assessment/Planning: POD #1 EVAR  Cr WNL Tolerating PO's well Pending breakfast and ambulating he will be discharged home today  Laurence Slate [facility] 04/18/2016 7:14 AM --  Laboratory Lab Results:  Recent Labs  04/17/16 1550 04/18/16 0540  WBC 6.2 7.6  HGB 10.1* 10.7*  HCT 31.2* 32.9*  PLT 195 198   BMET  Recent Labs  04/17/16 1550 04/18/16 0540  NA 141 142  K 3.8 4.2  CL 113* 109  CO2 20* 23  GLUCOSE 111* 118*  BUN 13 10  CREATININE 0.85 0.93  CALCIUM 8.1* 8.5*    COAG Lab Results  Component Value Date   INR 1.27 04/17/2016   INR 1.09 04/09/2016   No results found for: PTT

## 2016-04-18 NOTE — Progress Notes (Signed)
IJ removed by Tilda Burrow, RN. No complications noted. Pt tolerated well.

## 2016-04-18 NOTE — Care Management Note (Signed)
Case Management Note  Patient Details  Name: OSBALDO CORTAZZO MRN: [id number] Date of Birth: Dec 03, 1941  Subjective/Objective:   Patient from home with spouse, s/p AAA repair, for dc , no needs.                 Action/Plan:   Expected Discharge Date:                  Expected Discharge Plan:  Home/Self Care  In-House Referral:     Discharge planning Services  CM Consult  Post Acute Care Choice:    Choice offered to:     DME Arranged:    DME Agency:     HH Arranged:    [place] Agency:     Status of Service:  Completed, signed off  Medicare Important Message Given:    Date Medicare IM Given:    Medicare IM give by:    Date Additional Medicare IM Given:    Additional Medicare Important Message give by:     If discussed at [place] of Stay Meetings, dates discussed:    Additional Comments:  Zenon Mayo, RN 04/18/2016, 11:15 AM

## 2016-04-21 NOTE — Discharge Summary (Signed)
[facility] Discharge Summary   Patient ID:  Dillon Wagner MRN: [id number] DOB/AGE: 1941/08/13 75 y.o.  Admit date: 04/17/2016 Discharge date: 04/18/2016 Date of Surgery: 04/17/2016 Surgeon: Surgeon(s): Serafina Mitchell, MD  Admission Diagnosis: Abdominal aortic anuerysm I71.4  Discharge Diagnoses:  Abdominal aortic anuerysm I71.4  Secondary Diagnoses: Past Medical History  Diagnosis Date  . AAA (abdominal aortic aneurysm) (HCC)     4.9 x 4.6 cm  . Arthritis   . Diabetes ([place])   . Hyperthyroidism   . Hypertension   . Hyperlipidemia   . History of stress test 05/04/2012    negative stress test  . S/P wrist surgery 09/11/1958    right wrist remove  . Cancer ([place])     skin ca  removed    Procedure(s): ABDOMINAL AORTIC ENDOVASCULAR STENT GRAFT  Discharged Condition: good  HPI: This is a 75 year old gentleman who comes in today for evaluation of an abdominal aortic aneurysm. He has been followed with serial ultrasounds. He recently went for a CT scan which showed a 5.9 cm aneurysm. He denies any abdominal pain or back pain.  The patient suffers from diabetes. He states his blood sugars are usually in the 120 range and his hemoglobin A1c was around 6. He is medically managed for hypercholesterolemia with a statin. His blood pressure has been controlled. He is on aspirin. He denies any significant cardiac issues    Hospital Course:  Dillon Wagner is a 75 y.o. male is S/P Procedure(s): ABDOMINAL AORTIC ENDOVASCULAR STENT GRAFT  POD#1 Palpable DP 3+ bilaterally Groins soft without hematoma Heart RRR Lungs non labored breathing  Assessment/Planning: POD #1 EVAR  Cr WNL Tolerating PO's well Pending breakfast and ambulating he will be discharged home today  Stable for discharge home today    Significant Diagnostic Studies: CBC Lab Results  Component Value Date   WBC 7.6 04/18/2016   HGB 10.7* 04/18/2016   HCT 32.9* 04/18/2016   MCV 86.1 04/18/2016   PLT 198 04/18/2016    BMET    Component Value Date/Time   NA 142 04/18/2016 0540   K 4.2 04/18/2016 0540   CL 109 04/18/2016 0540   CO2 23 04/18/2016 0540   GLUCOSE 118* 04/18/2016 0540   BUN 10 04/18/2016 0540   CREATININE 0.93 04/18/2016 0540   CREATININE 1.07 03/11/2016 1330   CALCIUM 8.5* 04/18/2016 0540   GFRNONAA >60 04/18/2016 0540   GFRAA >60 04/18/2016 0540   COAG Lab Results  Component Value Date   INR 1.27 04/17/2016   INR 1.09 04/09/2016     Disposition:  Discharge to :Home Discharge Instructions    Call MD for:  redness, tenderness, or signs of infection (pain, swelling, bleeding, redness, odor or green/yellow discharge around incision site)    Complete by:  As directed      Call MD for:  severe or increased pain, loss or decreased feeling  in affected limb(s)    Complete by:  As directed      Call MD for:  temperature >100.5    Complete by:  As directed      Discharge patient    Complete by:  As directed   Discharge pt to home     Discharge wound care:    Complete by:  As directed   Wash wounds daily with soap and water and pat dry. Do not apply any creams or ointments on your incisions. You do not have to reapply a dressing.  Driving Restrictions    Complete by:  As directed   No driving for 2 weeks     Increase activity slowly    Complete by:  As directed   Walk with assistance use walker or cane as needed     Lifting restrictions    Complete by:  As directed   No lifting for 4 weeks     Resume previous diet    Complete by:  As directed             Medication List    TAKE these medications        aspirin 81 MG tablet  Take 81 mg by mouth daily.     diclofenac 75 MG EC tablet  Commonly known as:  VOLTAREN  Take 75 mg by mouth daily.     diltiazem 240 MG 24 hr capsule  Commonly known as:  DILACOR XR  Take 240 mg by mouth daily.     gabapentin 600 MG tablet  Commonly known as:  NEURONTIN  Take 600 mg by  mouth 2 (two) times daily.     glipiZIDE 5 MG tablet  Commonly known as:  GLUCOTROL  Take 5 mg by mouth 2 (two) times daily before a meal. Take 2.5mg  in the AM and 5mg  in the PM.     ketoconazole 2 % cream  Commonly known as:  NIZORAL  Apply 1 application topically 2 (two) times daily.     levothyroxine 75 MCG tablet  Commonly known as:  SYNTHROID, LEVOTHROID  Take 50 mcg by mouth daily before breakfast.     loratadine 10 MG tablet  Commonly known as:  CLARITIN  Take 10 mg by mouth daily.     metFORMIN 1000 MG tablet  Commonly known as:  GLUCOPHAGE  Take 1 tablet (1,000 mg total) by mouth 2 (two) times daily with a meal.     metoprolol succinate 100 MG 24 hr tablet  Commonly known as:  TOPROL-XL  Take 100 mg by mouth daily. Take with or immediately following a meal.     omeprazole 20 MG capsule  Commonly known as:  PRILOSEC  Take 20 mg by mouth 2 (two) times daily before a meal.     oxyCODONE-acetaminophen 5-325 MG tablet  Commonly known as:  ROXICET  Take 1 tablet by mouth every 6 (six) hours as needed.     simvastatin 20 MG tablet  Commonly known as:  ZOCOR  Take 20 mg by mouth daily.     tamsulosin 0.4 MG Caps capsule  Commonly known as:  FLOMAX  Take 0.4 mg by mouth daily after breakfast.       Verbal and written Discharge instructions given to the patient. Wound care per Discharge AVS     Follow-up Information    Follow up with Annamarie Major, MD In 4 weeks.   Specialties:  Vascular Surgery, Cardiology   Why:  Our office will call you to arrange an appointment (sent)   Contact information:   [address] [place] [address] [phone]       Signed: Laurence Slate [facility] 04/21/2016, 4:02 PM  - For VQI Registry use --- Instructions: Press F2 to tab through selections.  Delete question if not applicable.   Post-op:  Time to Extubation: [x ] In OR, [ ]  < 12 hrs, [ ]  12-24 hrs, [ ]  >=24 hrs Vasopressors Req. Post-op: No MI: [ ]  No, [ ]  Troponin  only, [ ]  EKG or Clinical New  Arrhythmia: No CHF: No ICU Stay: 1 days Transfusion: No  If yes, 0 units given  Complications: Resp failure: [x ] none, [ ]  Pneumonia, [ ]  Ventilator Chg in renal function: [x ] none, [ ]  Inc. Cr > 0.5, [ ]  Temp. Dialysis, [ ]  Permanent dialysis Leg ischemia: [x ] No, [ ]  Yes, no Surgery needed, [ ]  Yes, Surgery needed, [ ]  Amputation Bowel ischemia: [x ] No, [ ]  Medical Rx, [ ]  Surgical Rx Wound complication: [x ] No, [ ]  Superficial separation/infection, [ ]  Return to OR Return to OR: No  Return to OR for bleeding: No Stroke: [ ]  None, [ ]  Minor, [ ]  Major  Discharge medications: Statin use:  No  for medical reason   ASA use:  Yes Plavix use:  No  for medical reason   Beta blocker use:  Yes

## 2016-05-13 ENCOUNTER — Encounter: Payer: Self-pay | Admitting: Surgery

## 2016-05-19 ENCOUNTER — Encounter: Payer: Self-pay | Admitting: Surgery

## 2016-05-19 ENCOUNTER — Ambulatory Visit
Admit: 2016-05-19 | Discharge: 2016-05-19 | Disposition: A | Discharge status: Home / Self Care | Payer: Medicare Other | Attending: Surgery | Admitting: Surgery

## 2016-05-19 ENCOUNTER — Ambulatory Visit (INDEPENDENT_AMBULATORY_CARE_PROVIDER_SITE_OTHER): Payer: Medicare Other | Admitting: Surgery

## 2016-05-19 VITALS — BP 109/67 | HR 67 | Ht 70.0 in | Wt 192.0 lb

## 2016-05-19 DIAGNOSIS — I714 Abdominal aortic aneurysm, without rupture, unspecified: Secondary | ICD-10-CM

## 2016-05-19 DIAGNOSIS — Z95828 Presence of other vascular implants and grafts: Secondary | ICD-10-CM

## 2016-05-19 MED ORDER — IOPAMIDOL (ISOVUE-370) INJECTION 76%
75.0000 mL | Freq: Once | INTRAVENOUS | Status: AC | PRN
  Administered 2016-05-19: 75 mL via INTRAVENOUS

## 2016-05-19 NOTE — Progress Notes (Signed)
Patient name: Dillon Wagner MRN: [id number] DOB: [birth year] Sex: male  REASON FOR VISIT: Postop  HPI: Dillon Wagner is a 74 y.o. male returns today for follow-up.  He is status post endovascular repair of an infrarenal abdominal aortic aneurysm on 04/17/2016.  Maximum aortic diameter was 5.9 cm.  The patient's postoperative course was uncomplicated.  He is here today without complaints.  Current Outpatient Prescriptions  Medication Sig Dispense Refill  . aspirin 81 MG tablet Take 81 mg by mouth daily.    . diclofenac (VOLTAREN) 75 MG EC tablet Take 75 mg by mouth daily.    Marland Kitchen diltiazem (DILACOR XR) 240 MG 24 hr capsule Take 240 mg by mouth daily.    Marland Kitchen gabapentin (NEURONTIN) 600 MG tablet Take 600 mg by mouth 2 (two) times daily.    Marland Kitchen glipiZIDE (GLUCOTROL) 5 MG tablet Take 5 mg by mouth 2 (two) times daily before a meal. Take 2.5mg  in the AM and 5mg  in the PM.    . ketoconazole (NIZORAL) 2 % cream Apply 1 application topically 2 (two) times daily.    Marland Kitchen levothyroxine (SYNTHROID, LEVOTHROID) 75 MCG tablet Take 50 mcg by mouth daily before breakfast.     . loratadine (CLARITIN) 10 MG tablet Take 10 mg by mouth daily.    . metFORMIN (GLUCOPHAGE) 1000 MG tablet Take 1 tablet (1,000 mg total) by mouth 2 (two) times daily with a meal. 30 tablet 0  . metoprolol succinate (TOPROL-XL) 100 MG 24 hr tablet Take 100 mg by mouth daily. Take with or immediately following a meal.    . omeprazole (PRILOSEC) 20 MG capsule Take 20 mg by mouth 2 (two) times daily before a meal.     . simvastatin (ZOCOR) 20 MG tablet Take 20 mg by mouth daily.    . tamsulosin (FLOMAX) 0.4 MG CAPS Take 0.4 mg by mouth daily after breakfast.    . oxyCODONE-acetaminophen (ROXICET) 5-325 MG tablet Take 1 tablet by mouth every 6 (six) hours as needed. (Patient not taking: Reported on 05/19/2016) 15 tablet 0   No current facility-administered medications for this visit.    REVIEW OF SYSTEMS:  [X]  denotes positive finding, [ ]   denotes negative finding Cardiac  Comments:  Chest pain or chest pressure:    Shortness of breath upon exertion:    Short of breath when lying flat:    Irregular heart rhythm:    Constitutional    Fever or chills:      PHYSICAL EXAM: Filed Vitals:   05/19/16 1259  BP: 109/67  Pulse: 67  Height: 5\' 10"  (1.778 m)  Weight: 192 lb (87.091 kg)  SpO2: 97%    GENERAL: The patient is a well-nourished male, in no acute distress. The vital signs are documented above. CARDIOVASCULAR: There is a regular rate and rhythm. PULMONARY: There is good air exchange bilaterally without wheezing or rales. Both groin incisions are healing appropriately.  Abdomen is soft.  Extremities are warm and well perfused  Data: I have reviewed his first postoperative CT angiogram.  This shows successful exclusion of the aneurysm with a stent graft in good position.  There is no evidence of endoleak.  MEDICAL ISSUES: Status post endovascular aneurysm repair of a 5.9 cm infrarenal abdominal aortic aneurysm.  The patient is doing very well this time.  He will follow up in 6 months with an abdominal ultrasound.  Based on the images if it is appropriate, I will go to one year follow-up with ultrasound  The patient had preoperative carotid Doppler studies which showed normal velocities.  I will not reorder these.  Annamarie Major MD [facility] [place] Tel: [phone] Pager: [phone]

## 2016-07-29 ENCOUNTER — Other Ambulatory Visit: Payer: Self-pay | Admitting: Surgery

## 2016-07-29 DIAGNOSIS — I714 Abdominal aortic aneurysm, without rupture, unspecified: Secondary | ICD-10-CM

## 2016-11-12 ENCOUNTER — Encounter: Payer: Self-pay | Admitting: Surgery

## 2016-11-24 ENCOUNTER — Ambulatory Visit (HOSPITAL_COMMUNITY)
Admission: RE | Admit: 2016-11-24 | Discharge: 2016-11-24 | Disposition: A | Discharge status: Home / Self Care | Payer: Medicare Other | Source: Ambulatory Visit | Attending: Surgery | Admitting: Surgery

## 2016-11-24 ENCOUNTER — Encounter: Payer: Self-pay | Admitting: Surgery

## 2016-11-24 ENCOUNTER — Ambulatory Visit (INDEPENDENT_AMBULATORY_CARE_PROVIDER_SITE_OTHER): Payer: Medicare Other | Admitting: Surgery

## 2016-11-24 VITALS — BP 115/74 | HR 57 | Temp 98.9°F | Resp 18 | Ht 70.0 in | Wt 189.5 lb

## 2016-11-24 DIAGNOSIS — I714 Abdominal aortic aneurysm, without rupture, unspecified: Secondary | ICD-10-CM

## 2016-11-24 NOTE — Addendum Note (Signed)
Addended by: Lianne Cure A on: 11/24/2016 10:29 AM   Modules accepted: Orders

## 2016-11-24 NOTE — Progress Notes (Signed)
[facility] [place]  Patient name: Dillon Wagner MRN: [id number] DOB: 11/14/1941 Sex: male  REASON FOR VISIT: AAA  HPI: Dillon Wagner is a 75 y.o. male returns today for follow-up.  He is status post endovascular repair of a 5.9 cm infrarenal abdominal aortic aneurysm on 04/17/2016.  His postoperative course was uncomplicated.  His postoperative CT angiogram showed a good position of the stent graft with no evidence of endoleak.  The patient has no complaints today.  He denies abdominal pain or back pain.  Past Medical History:  Diagnosis Date  . AAA (abdominal aortic aneurysm) (HCC)    4.9 x 4.6 cm  . Arthritis   . Cancer ([facility])    skin ca  removed  . Diabetes ([place])   . History of stress test 05/04/2012   negative stress test  . Hyperlipidemia   . Hypertension   . Hyperthyroidism   . S/P wrist surgery 09/11/1958   right wrist remove    Family History  Problem Relation Age of Onset  . Cancer Mother 36    ovarian  . Cancer Father 40    cancer in the stomach    SOCIAL HISTORY: Social History  Substance Use Topics  . Smoking status: Former Smoker    Quit date: 04/28/1986  . Smokeless tobacco: Former Systems developer    Quit date: 03/22/1987  . Alcohol use No    Allergies  Allergen Reactions  . Morphine And Related Other (See Comments)    Altered mental status (did not recognize spouse)  . Buprenorphine Hcl     Other reaction(s): Other (See Comments) Altered mental status (did not recognize spouse)  . Codeine Nausea And Vomiting    Current Outpatient Prescriptions  Medication Sig Dispense Refill  . aspirin 81 MG tablet Take 81 mg by mouth daily.    . diclofenac (VOLTAREN) 75 MG EC tablet Take 75 mg by mouth daily.    Marland Kitchen diltiazem (DILACOR XR) 240 MG 24 hr capsule Take 240 mg by mouth daily.    Marland Kitchen gabapentin (NEURONTIN) 600 MG tablet Take 600 mg by mouth 2 (two) times daily.    Marland Kitchen glipiZIDE (GLUCOTROL) 5 MG tablet Take  5 mg by mouth 2 (two) times daily before a meal. Take 2.5mg  in the AM and 5mg  in the PM.    . levothyroxine (SYNTHROID, LEVOTHROID) 75 MCG tablet Take 50 mcg by mouth daily before breakfast.     . loratadine (CLARITIN) 10 MG tablet Take 10 mg by mouth daily.    . metFORMIN (GLUCOPHAGE) 1000 MG tablet Take 1 tablet (1,000 mg total) by mouth 2 (two) times daily with a meal. (Patient taking differently: Take 500 mg by mouth 2 (two) times daily with a meal. ) 30 tablet 0  . metoprolol succinate (TOPROL-XL) 100 MG 24 hr tablet Take 100 mg by mouth daily. Take with or immediately following a meal.    . omeprazole (PRILOSEC) 20 MG capsule Take 20 mg by mouth 2 (two) times daily before a meal.     . oxyCODONE-acetaminophen (ROXICET) 5-325 MG tablet Take 1 tablet by mouth every 6 (six) hours as needed. 15 tablet 0  . simvastatin (ZOCOR) 20 MG tablet Take 20 mg by mouth daily.    . tamsulosin (FLOMAX) 0.4 MG CAPS Take 0.4 mg by mouth daily after breakfast.    . ketoconazole (NIZORAL) 2 % cream Apply 1 application topically 2 (two) times daily.     No current facility-administered medications  for this visit.     REVIEW OF SYSTEMS:  [X]  denotes positive finding, [ ]  denotes negative finding Cardiac  Comments:  Chest pain or chest pressure:    Shortness of breath upon exertion:    Short of breath when lying flat:    Irregular heart rhythm:        Vascular    Pain in calf, thigh, or hip brought on by ambulation:    Pain in feet at night that wakes you up from your sleep:     Blood clot in your veins:    Leg swelling:         Pulmonary    Oxygen at home:    Productive cough:     Wheezing:         Neurologic    Sudden weakness in arms or legs:     Sudden numbness in arms or legs:     Sudden onset of difficulty speaking or slurred speech:    Temporary loss of vision in one eye:     Problems with dizziness:         Gastrointestinal    Blood in stool:     Vomited blood:         Genitourinary     Burning when urinating:     Blood in urine:        Psychiatric    Major depression:         Hematologic    Bleeding problems:    Problems with blood clotting too easily:        Skin    Rashes or ulcers:        Constitutional    Fever or chills:      PHYSICAL EXAM: Vitals:   11/24/16 0935  BP: 115/74  Pulse: (!) 57  Resp: 18  Temp: 98.9 F (37.2 C)  TempSrc: Oral  SpO2: 100%  Weight: 189 lb 8 oz (86 kg)  Height: 5\' 10"  (1.778 m)    GENERAL: The patient is a well-nourished male, in no acute distress. The vital signs are documented above. CARDIAC: There is a regular rate and rhythm.  PULMONARY: Non-labored respiration. ABDOMEN: Soft and non-tender with normal pitched bowel sounds.  MUSCULOSKELETAL: There are no major deformities or cyanosis. NEUROLOGIC: No focal weakness or paresthesias are detected. SKIN: There are no ulcers or rashes noted. PSYCHIATRIC: The patient has a normal affect.  DATA:  I have ordered and reviewed his ultrasound which shows no evidence of endoleak with a maximum diameter of 5.5 cm  MEDICAL ISSUES: Status post endovascular aneurysm repair.  The patient's ultrasound shows a slight decrease in the size of the aneurysm with no evidence of endoleak.  He will follow up in one year with an ultrasound.    Annamarie Major, MD [facility] [phone] Pager [phone]

## 2017-03-04 ENCOUNTER — Encounter: Payer: Self-pay | Admitting: Cardiovascular Disease

## 2017-03-04 ENCOUNTER — Encounter (INDEPENDENT_AMBULATORY_CARE_PROVIDER_SITE_OTHER): Payer: Self-pay

## 2017-03-04 ENCOUNTER — Ambulatory Visit (INDEPENDENT_AMBULATORY_CARE_PROVIDER_SITE_OTHER): Payer: Medicare HMO | Admitting: Cardiovascular Disease

## 2017-03-04 VITALS — BP 138/74 | HR 61 | Ht 70.0 in | Wt 195.8 lb

## 2017-03-04 DIAGNOSIS — I714 Abdominal aortic aneurysm, without rupture, unspecified: Secondary | ICD-10-CM

## 2017-03-04 DIAGNOSIS — E7800 Pure hypercholesterolemia, unspecified: Secondary | ICD-10-CM | POA: Diagnosis not present

## 2017-03-04 DIAGNOSIS — I10 Essential (primary) hypertension: Secondary | ICD-10-CM

## 2017-03-04 IMAGING — CR DG CHEST 1V PORT
1 series · 1 of 1 positions shown · non-contrast
Comparison: None.

CLINICAL DATA: Status post abdominal aortic aneurysm repair.

EXAM:
PORTABLE CHEST 1 VIEW

[AP]
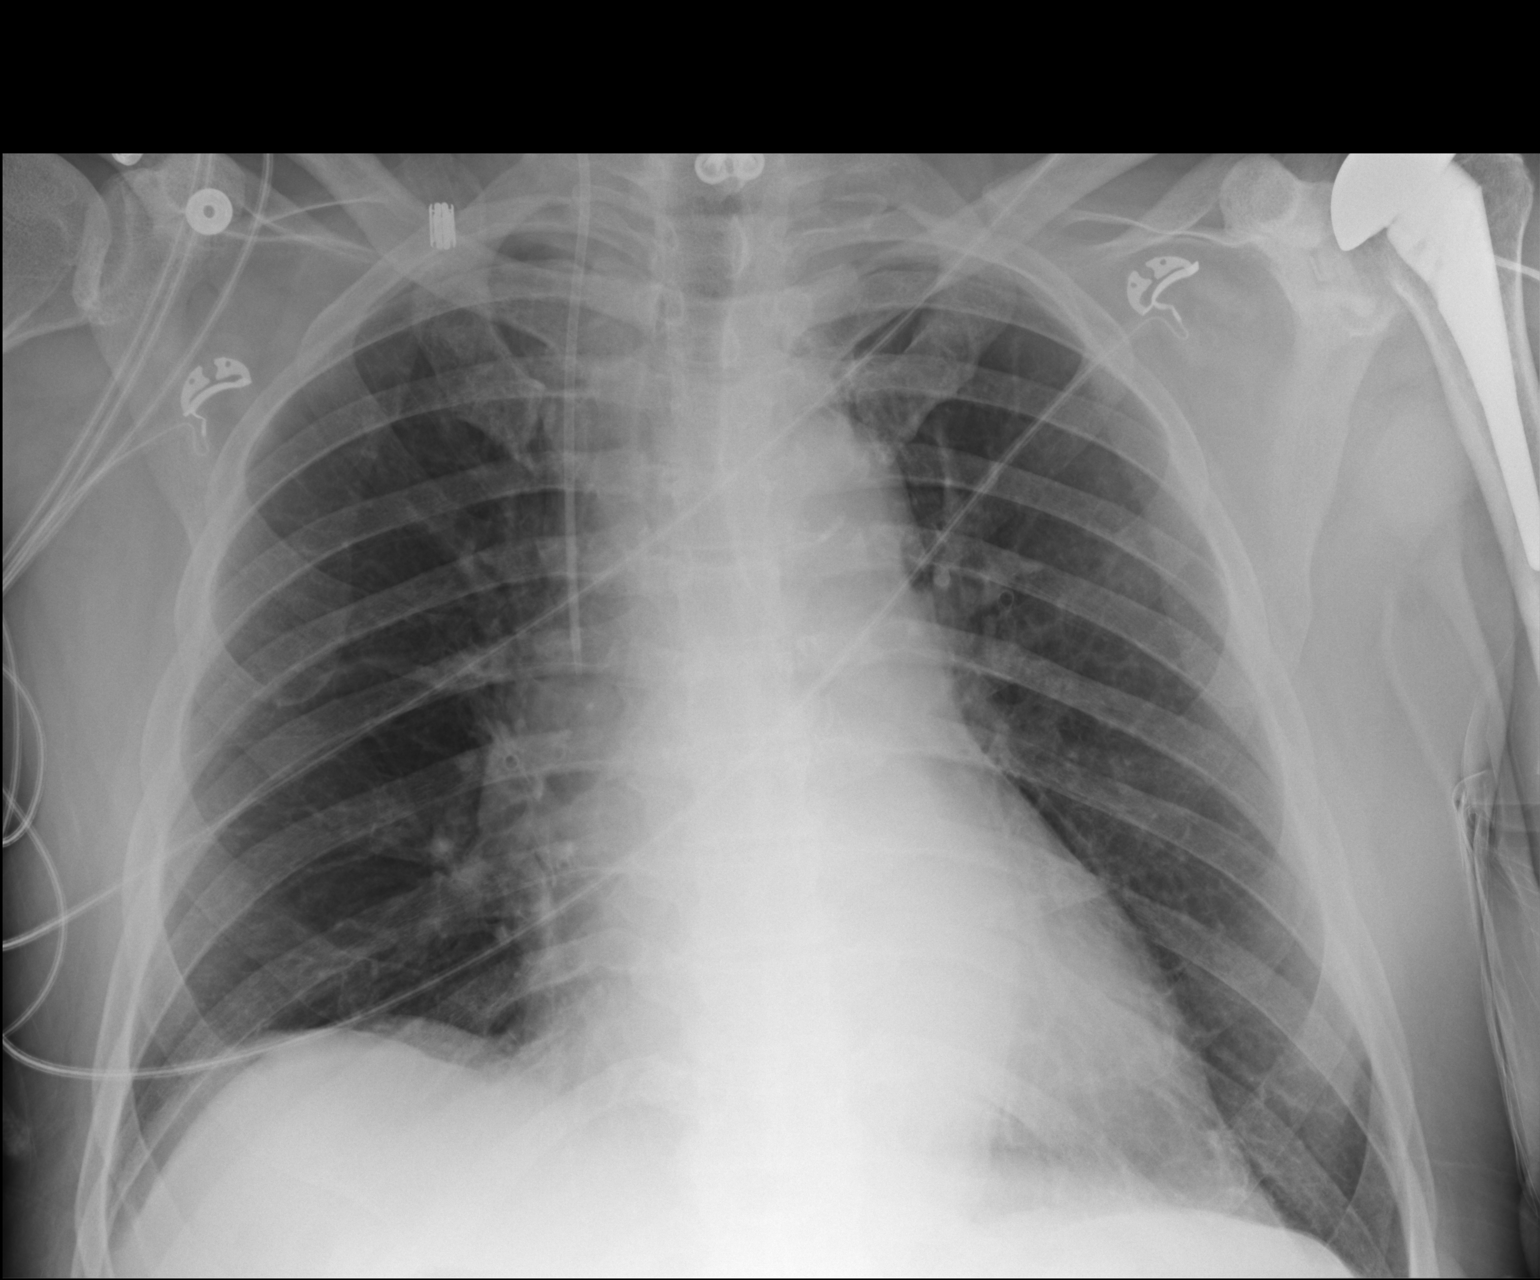

[1 of 1 positions shown; findings below may reference images not displayed]

FINDINGS: The heart size and mediastinal contours are within normal limits.
Both lungs are clear. No pneumothorax or pleural effusion is noted.
Right internal jugular venous catheter is noted with tip in expected
position of the SVC. Status post left shoulder arthroplasty.
IMPRESSION: No acute cardiopulmonary abnormality seen. Right internal jugular
central venous catheter tip in expected position of SVC.

## 2017-03-04 IMAGING — CR DG ABD PORTABLE 1V
2 series · 2 of 2 positions shown · non-contrast
Comparison: None.

CLINICAL DATA: Status post abdominal aortic aneurysm repair.

EXAM:
PORTABLE ABDOMEN - 1 VIEW

[AP (1 of 2)]
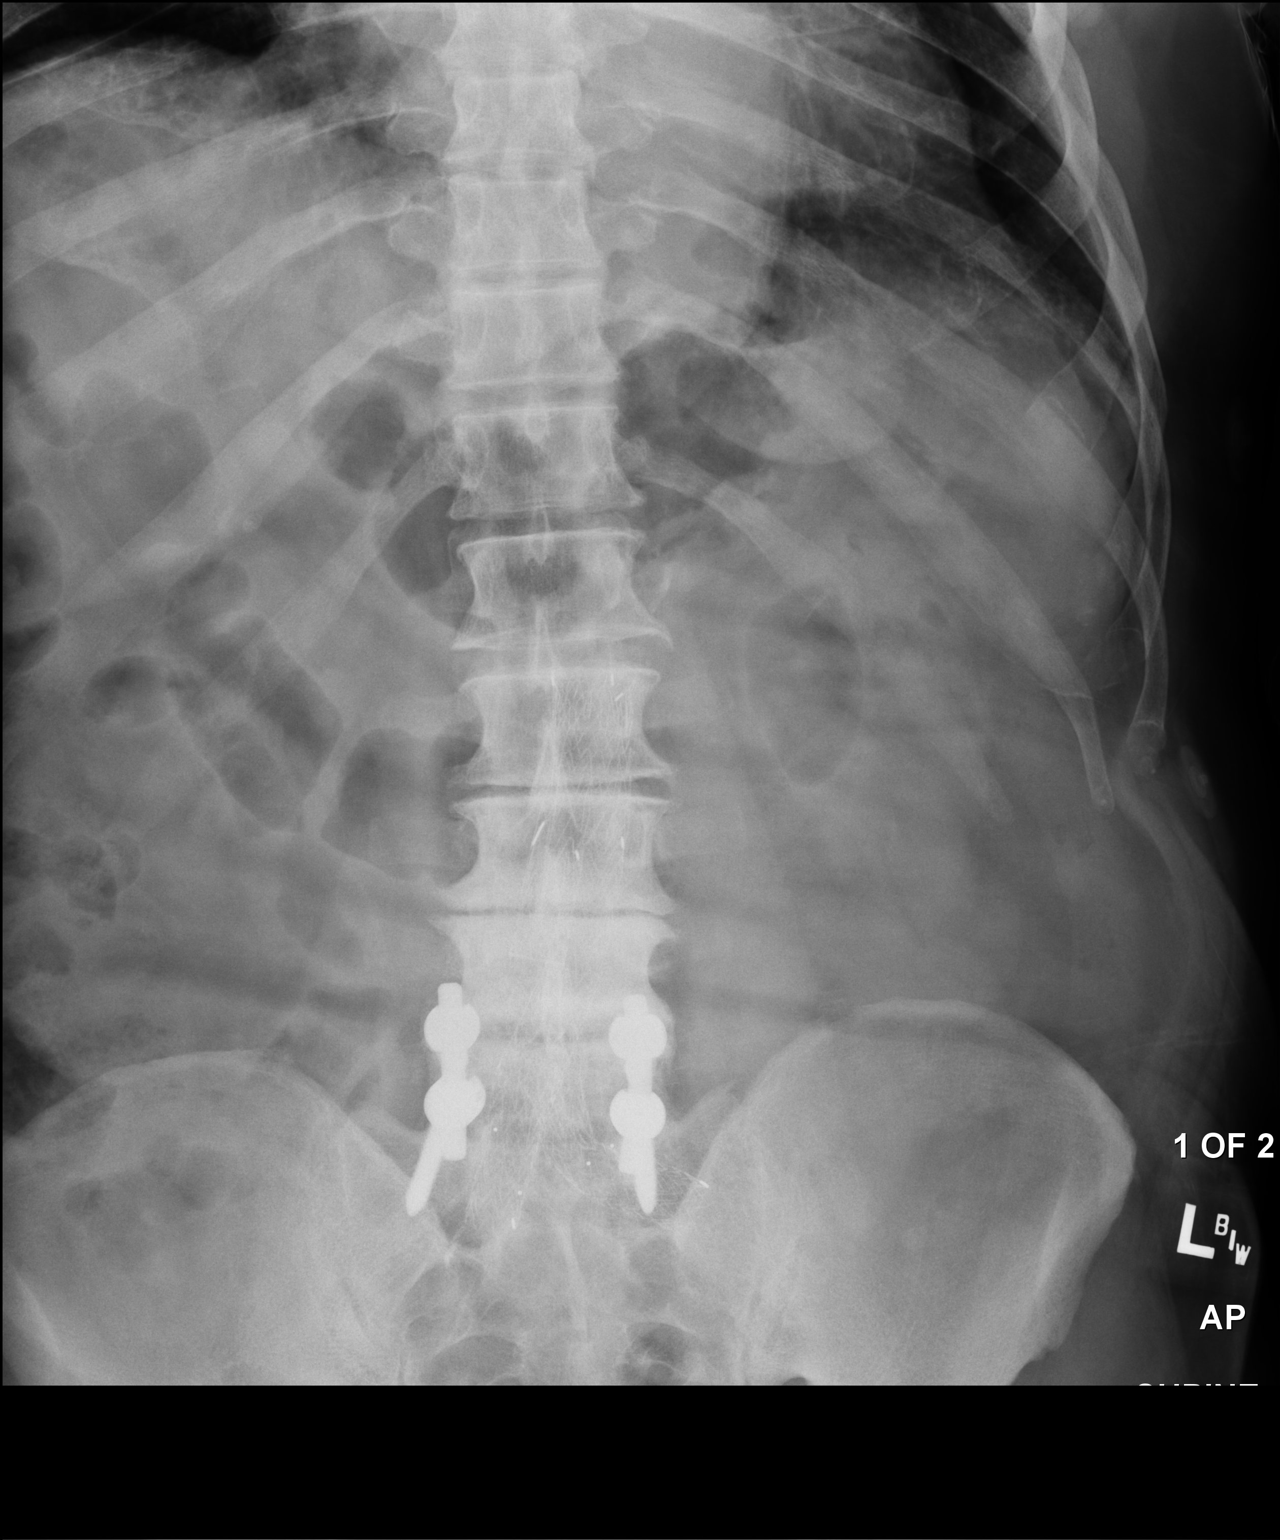

[AP (2 of 2)]
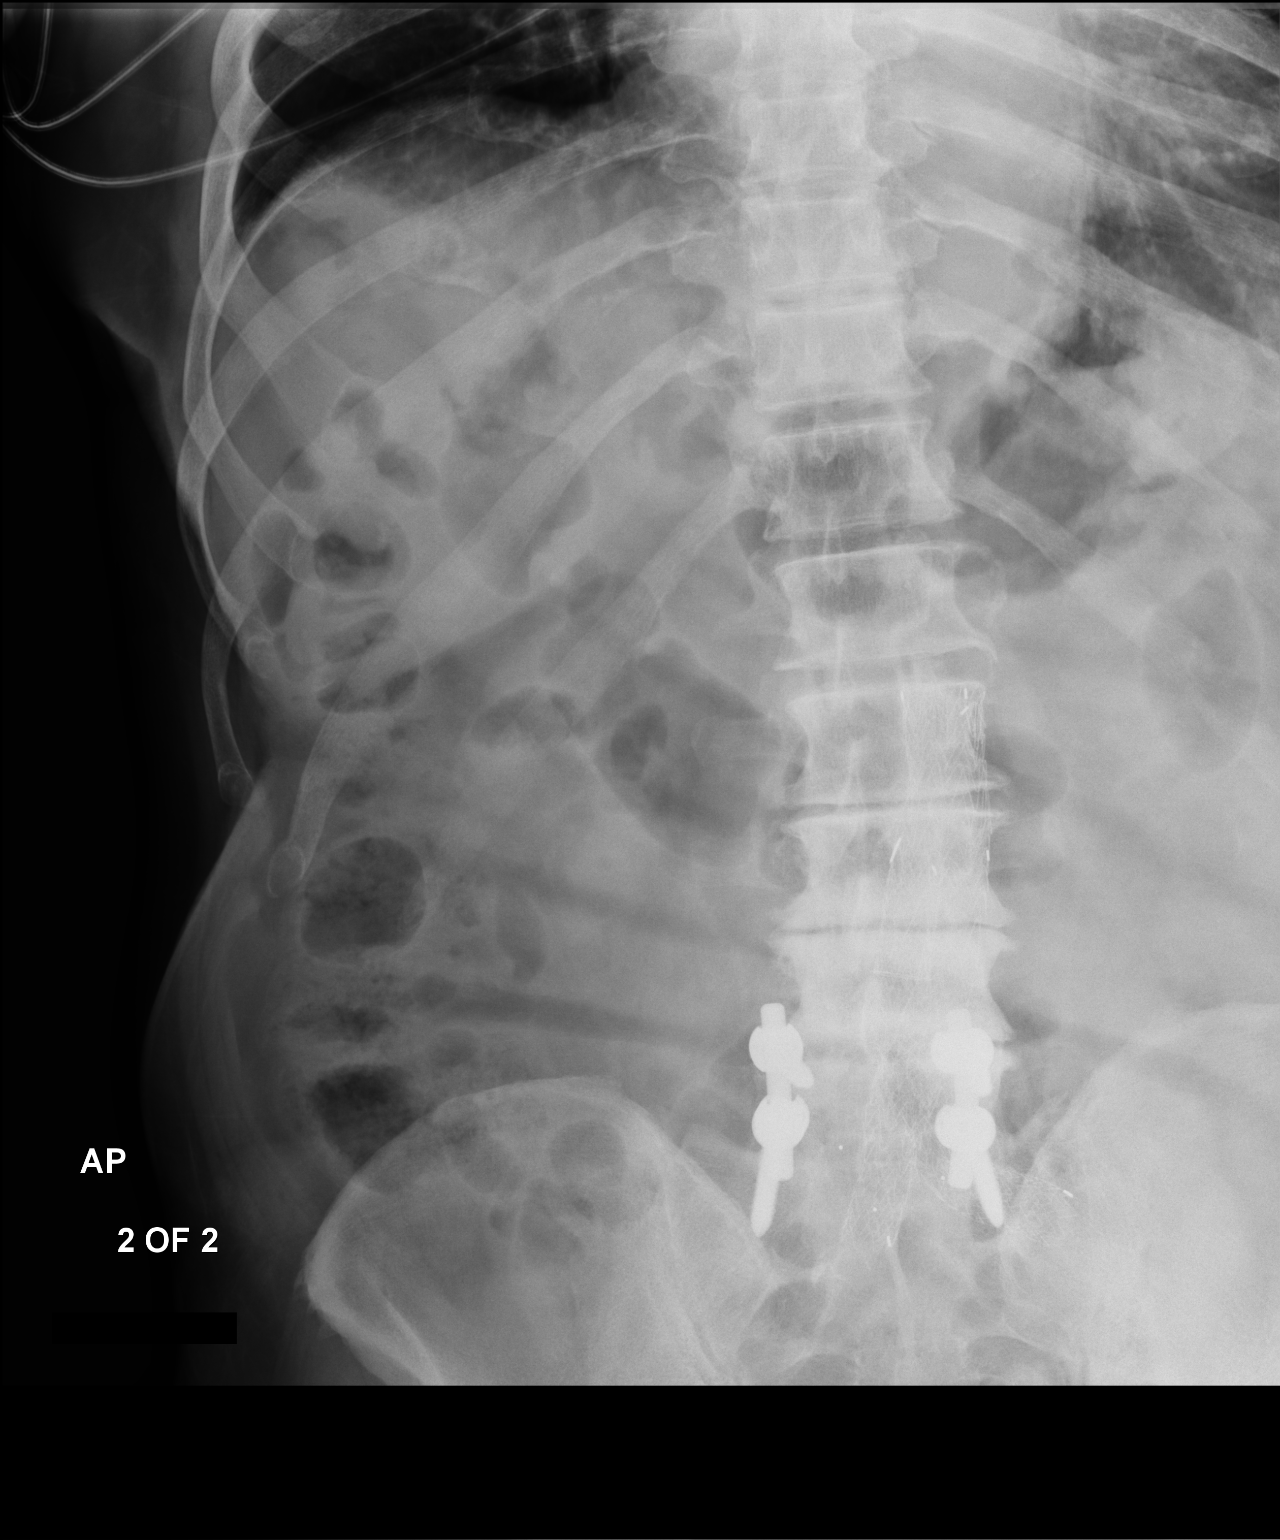

[2 of 2 positions shown; findings below may reference images not displayed]

FINDINGS: The bowel gas pattern is normal. Status post surgical posterior
fusion of lower lumbar spine. Status post endo graft repair of
abdominal aortic aneurysm.
IMPRESSION: Status post endograft repair of abdominal aortic aneurysm. No
evidence of bowel obstruction or ileus.

## 2017-03-04 NOTE — Patient Instructions (Signed)

## 2017-03-04 NOTE — Assessment & Plan Note (Signed)
History of hypertension with blood pressure measures 130/74. He is on diltiazem, and metoprolol. Continue current meds at current dosing

## 2017-03-04 NOTE — Assessment & Plan Note (Signed)
History of hyperlipidemia on statin therapy followed by his PCP 

## 2017-03-04 NOTE — Progress Notes (Signed)
03/04/2017 Dillon Wagner   1941/10/18  [id number]  Primary Physician DAVIS,JAMES W, MD Primary Cardiologist: Lorretta Harp MD [name]  HPI:  The patient is a 76 year old, mildly overweight, married Caucasian male, father of 64, grandfather to 2 grandchildren who I last in the office 03/04/16 He is referred to me through the courtesy of Dr. Thurnell Garbe for evaluation of abdominal aortic aneurysm found on CT scanning performed because of abdominal pain June 20, 2011.   The patient's risk factors are positive for hypertension, hyperlipidemia and type 2 diabetes. He quit smoking back in 1987 and smoked 30 pack-years prior to that. He has no family history of heart disease. He never had a heart attack or stroke and denied any chest pain or shortness of breath. He did retired from [employer] where he worked up until 18 years ago. He had a Myoview stress test performed in our office May 04, 2012, for preoperative clearance before elective left shoulder replacement which was negative. He had this done successfully at [facility] in November. We have been following his abdominal aortic ultrasound which most recently done in March of last year was 4.9 x 4.6 cm, not large enough to warrant revascularization. Abdominal ultrasound performed 01/17/16 revealed a maximum dimension of 4.9 cm. Dr. Alean Rinne at [facility] primary care follows his cholesterol. Since I saw him back over a year ago he has undergone endoluminal stent grafting for a 5.9 cm abdominal aortic admission by Dr. Trula Slade  04/17/16. Follow-up CT angiogram performed the following month showed this to be well-placed and widely patent. Otherwise, he remains asymptomatic.   Current Outpatient Prescriptions  Medication Sig Dispense Refill  . aspirin 81 MG tablet Take 81 mg by mouth daily.    . diclofenac (VOLTAREN) 75 MG EC tablet Take 75 mg by mouth daily.    Marland Kitchen diltiazem (DILACOR XR) 240 MG 24 hr capsule Take 240 mg by  mouth daily.    Marland Kitchen gabapentin (NEURONTIN) 600 MG tablet Take 600 mg by mouth 2 (two) times daily.    Marland Kitchen glipiZIDE (GLUCOTROL) 5 MG tablet Take 5 mg by mouth 2 (two) times daily before a meal. Take 2.5mg  in the AM and 5mg  in the PM.    . levothyroxine (SYNTHROID, LEVOTHROID) 75 MCG tablet Take 50 mcg by mouth daily before breakfast.     . loratadine (CLARITIN) 10 MG tablet Take 10 mg by mouth daily.    . metFORMIN (GLUCOPHAGE) 1000 MG tablet Take 1 tablet (1,000 mg total) by mouth 2 (two) times daily with a meal. (Patient taking differently: Take 500 mg by mouth 2 (two) times daily with a meal. ) 30 tablet 0  . metoprolol succinate (TOPROL-XL) 100 MG 24 hr tablet Take 100 mg by mouth daily. Take with or immediately following a meal.    . omeprazole (PRILOSEC) 20 MG capsule Take 20 mg by mouth 2 (two) times daily before a meal.     . simvastatin (ZOCOR) 20 MG tablet Take 20 mg by mouth daily.    . tamsulosin (FLOMAX) 0.4 MG CAPS Take 0.4 mg by mouth daily after breakfast.     No current facility-administered medications for this visit.     Allergies  Allergen Reactions  . Morphine And Related Other (See Comments)    Altered mental status (did not recognize spouse)  . Buprenorphine Hcl     Other reaction(s): Other (See Comments) Altered mental status (did not recognize spouse)  .  Codeine Nausea And Vomiting    Social History   Social History  . Marital status: Married    Spouse name: N/A  . Number of children: N/A  . Years of education: N/A   Occupational History  . Not on file.   Social History Main Topics  . Smoking status: Former Smoker    Quit date: 04/28/1986  . Smokeless tobacco: Former Systems developer    Quit date: 03/22/1987  . Alcohol use No  . Drug use: No  . Sexual activity: Not on file   Other Topics Concern  . Not on file   Social History Narrative  . No narrative on file     Review of Systems: General: negative for chills, fever, night sweats or weight changes.    Cardiovascular: negative for chest pain, dyspnea on exertion, edema, orthopnea, palpitations, paroxysmal nocturnal dyspnea or shortness of breath Dermatological: negative for rash Respiratory: negative for cough or wheezing Urologic: negative for hematuria Abdominal: negative for nausea, vomiting, diarrhea, bright red blood per rectum, melena, or hematemesis Neurologic: negative for visual changes, syncope, or dizziness All other systems reviewed and are otherwise negative except as noted above.    Blood pressure 138/74, pulse 61, height 5\' 10"  (1.778 m), weight 195 lb 12.8 oz (88.8 kg).  General appearance: alert and no distress Neck: no adenopathy, no carotid bruit, no JVD, supple, symmetrical, trachea midline and thyroid not enlarged, symmetric, no tenderness/mass/nodules Lungs: clear to auscultation bilaterally Heart: regular rate and rhythm, S1, S2 normal, no murmur, click, rub or gallop Extremities: extremities normal, atraumatic, no cyanosis or edema  EKG sinus rhythm of 61 without ST or T-wave changes. I personally reviewed this EKG  ASSESSMENT AND PLAN:   HLD (hyperlipidemia) History of hyperlipidemia on statin therapy followed by his PCP  Essential hypertension History of hypertension with blood pressure measures 130/74. He is on diltiazem, and metoprolol. Continue current meds at current dosing  AAA (abdominal aortic aneurysm) History of abdominal aortic aneurysm measuring 5.9 cm by CT angiogram status post endoluminal stent grafting by Dr. Trula Slade 04/17/69 with excellent result. Follow-up CT angiogram showed a well-placed endograft.      Lorretta Harp MD FACP,FACC,FAHA, [facility] 03/04/2017 10:48 AM

## 2017-03-04 NOTE — Assessment & Plan Note (Signed)
History of abdominal aortic aneurysm measuring 5.9 cm by CT angiogram status post endoluminal stent grafting by Dr. Trula Slade 04/17/69 with excellent result. Follow-up CT angiogram showed a well-placed endograft.

## 2017-05-12 DIAGNOSIS — Z0100 Encounter for examination of eyes and vision without abnormal findings: Secondary | ICD-10-CM | POA: Diagnosis not present

## 2017-05-26 DIAGNOSIS — R1084 Generalized abdominal pain: Secondary | ICD-10-CM | POA: Diagnosis not present

## 2017-05-26 DIAGNOSIS — R69 Illness, unspecified: Secondary | ICD-10-CM | POA: Diagnosis not present

## 2017-05-26 DIAGNOSIS — R197 Diarrhea, unspecified: Secondary | ICD-10-CM | POA: Diagnosis not present

## 2017-06-01 DIAGNOSIS — R197 Diarrhea, unspecified: Secondary | ICD-10-CM | POA: Diagnosis not present

## 2017-06-01 DIAGNOSIS — R1084 Generalized abdominal pain: Secondary | ICD-10-CM | POA: Diagnosis not present

## 2017-06-05 DIAGNOSIS — E1169 Type 2 diabetes mellitus with other specified complication: Secondary | ICD-10-CM | POA: Diagnosis not present

## 2017-06-11 DIAGNOSIS — R197 Diarrhea, unspecified: Secondary | ICD-10-CM | POA: Diagnosis not present

## 2017-06-11 DIAGNOSIS — K5730 Diverticulosis of large intestine without perforation or abscess without bleeding: Secondary | ICD-10-CM | POA: Diagnosis not present

## 2017-06-11 DIAGNOSIS — E119 Type 2 diabetes mellitus without complications: Secondary | ICD-10-CM | POA: Diagnosis not present

## 2017-06-11 DIAGNOSIS — Z86010 Personal history of colonic polyps: Secondary | ICD-10-CM | POA: Diagnosis not present

## 2017-06-16 DIAGNOSIS — R69 Illness, unspecified: Secondary | ICD-10-CM | POA: Diagnosis not present

## 2017-07-06 DIAGNOSIS — R1032 Left lower quadrant pain: Secondary | ICD-10-CM | POA: Diagnosis not present

## 2017-07-07 DIAGNOSIS — H43812 Vitreous degeneration, left eye: Secondary | ICD-10-CM | POA: Diagnosis not present

## 2017-07-07 DIAGNOSIS — H5052 Exophoria: Secondary | ICD-10-CM | POA: Diagnosis not present

## 2017-07-07 DIAGNOSIS — H532 Diplopia: Secondary | ICD-10-CM | POA: Diagnosis not present

## 2017-07-07 DIAGNOSIS — H35371 Puckering of macula, right eye: Secondary | ICD-10-CM | POA: Diagnosis not present

## 2017-07-14 DIAGNOSIS — R1032 Left lower quadrant pain: Secondary | ICD-10-CM | POA: Diagnosis not present

## 2017-07-27 DIAGNOSIS — L738 Other specified follicular disorders: Secondary | ICD-10-CM | POA: Diagnosis not present

## 2017-07-27 DIAGNOSIS — X32XXXA Exposure to sunlight, initial encounter: Secondary | ICD-10-CM | POA: Diagnosis not present

## 2017-07-27 DIAGNOSIS — L570 Actinic keratosis: Secondary | ICD-10-CM | POA: Diagnosis not present

## 2017-07-27 DIAGNOSIS — C44519 Basal cell carcinoma of skin of other part of trunk: Secondary | ICD-10-CM | POA: Diagnosis not present

## 2017-07-27 DIAGNOSIS — L821 Other seborrheic keratosis: Secondary | ICD-10-CM | POA: Diagnosis not present

## 2017-07-27 DIAGNOSIS — D485 Neoplasm of uncertain behavior of skin: Secondary | ICD-10-CM | POA: Diagnosis not present

## 2017-07-27 DIAGNOSIS — I7300 Raynaud's syndrome without gangrene: Secondary | ICD-10-CM | POA: Diagnosis not present

## 2017-07-27 DIAGNOSIS — S60931A Unspecified superficial injury of right thumb, initial encounter: Secondary | ICD-10-CM | POA: Diagnosis not present

## 2017-07-27 DIAGNOSIS — L814 Other melanin hyperpigmentation: Secondary | ICD-10-CM | POA: Diagnosis not present

## 2017-08-10 DIAGNOSIS — R109 Unspecified abdominal pain: Secondary | ICD-10-CM | POA: Diagnosis not present

## 2017-08-10 DIAGNOSIS — E039 Hypothyroidism, unspecified: Secondary | ICD-10-CM | POA: Diagnosis not present

## 2017-08-10 DIAGNOSIS — I10 Essential (primary) hypertension: Secondary | ICD-10-CM | POA: Diagnosis not present

## 2017-08-10 DIAGNOSIS — Z7984 Long term (current) use of oral hypoglycemic drugs: Secondary | ICD-10-CM | POA: Diagnosis not present

## 2017-08-10 DIAGNOSIS — E785 Hyperlipidemia, unspecified: Secondary | ICD-10-CM | POA: Diagnosis not present

## 2017-08-10 DIAGNOSIS — K4090 Unilateral inguinal hernia, without obstruction or gangrene, not specified as recurrent: Secondary | ICD-10-CM | POA: Diagnosis not present

## 2017-08-10 DIAGNOSIS — E1169 Type 2 diabetes mellitus with other specified complication: Secondary | ICD-10-CM | POA: Diagnosis not present

## 2017-08-10 DIAGNOSIS — M2550 Pain in unspecified joint: Secondary | ICD-10-CM | POA: Diagnosis not present

## 2017-08-10 DIAGNOSIS — Z79899 Other long term (current) drug therapy: Secondary | ICD-10-CM | POA: Diagnosis not present

## 2017-08-24 DIAGNOSIS — C44519 Basal cell carcinoma of skin of other part of trunk: Secondary | ICD-10-CM | POA: Diagnosis not present

## 2017-09-03 DIAGNOSIS — K4090 Unilateral inguinal hernia, without obstruction or gangrene, not specified as recurrent: Secondary | ICD-10-CM | POA: Diagnosis not present

## 2017-09-03 DIAGNOSIS — I10 Essential (primary) hypertension: Secondary | ICD-10-CM | POA: Diagnosis not present

## 2017-09-03 DIAGNOSIS — E119 Type 2 diabetes mellitus without complications: Secondary | ICD-10-CM | POA: Diagnosis not present

## 2017-09-21 ENCOUNTER — Telehealth: Payer: Self-pay | Admitting: Cardiovascular Disease

## 2017-09-21 NOTE — Telephone Encounter (Signed)
Requesting surgical clearance:  1. Type of surgery: Left Inguinal Hernia Repair  2. Surgeon: not specified  3.Surgical Date: tbd  4. Medications that need to be held: ASA for 7 days prior   5. CAD: No  6. I will defer to:  Dr. Pearla Dubonnet Information:  [facility] Fax: [phone]

## 2017-09-23 NOTE — Telephone Encounter (Signed)
Cleared for hernia repair

## 2017-09-25 NOTE — Telephone Encounter (Signed)
Clearance routed to number provided via EPIC. 

## 2017-10-14 DIAGNOSIS — R69 Illness, unspecified: Secondary | ICD-10-CM | POA: Diagnosis not present

## 2017-10-15 NOTE — Progress Notes (Signed)
Please place orders in EPIC as patient is being scheduled for a pre-op appointment! Thank you! 

## 2017-10-19 NOTE — Progress Notes (Signed)
Please place orders in EPIC as patient has a pre-op appointment on 10/22/2017! Thank you! 

## 2017-10-21 NOTE — Patient Instructions (Signed)
CAMERAN PETTEY  10/21/2017   Your procedure is scheduled on: 10-27-17  Report to [facility] Main  Entrance Take [place]  elevators to 3rd floor to  [place] at 915AM.    Call this number if you have problems the morning of surgery [phone]    Remember: ONLY 1 PERSON MAY GO WITH YOU TO SHORT STAY TO GET  READY MORNING OF [place].  Do not eat food or drink liquids :After Midnight.     Take these medicines the morning of surgery with A SIP OF WATER: diltiazem(dilacor), gabapentin, levothyroxine, tylenol as needed                                You may not have any metal on your body including hair pins and              piercings  Do not wear jewelry, make-up, lotions, powders or perfumes, deodorant                       Men may shave face and neck.   Do not bring valuables to the hospital. [place].  Contacts, dentures or bridgework may not be worn into surgery.       Patients discharged the day of surgery will not be allowed to drive home.  Name and phone number of your driver:  Special Instructions: N/A              Please read over the following fact sheets you were given: _____________________________________________________________________            How to Manage Your Diabetes Before and After Surgery  Why is it important to control my blood sugar before and after surgery? . Improving blood sugar levels before and after surgery helps healing and can limit problems. . A way of improving blood sugar control is eating a healthy diet by: o  Eating less sugar and carbohydrates o  Increasing activity/exercise o  Talking with your doctor about reaching your blood sugar goals . High blood sugars (greater than 180 mg/dL) can raise your risk of infections and slow your recovery, so you will need to focus on controlling your diabetes during the weeks before surgery. . Make sure that the  doctor who takes care of your diabetes knows about your planned surgery including the date and location.  How do I manage my blood sugar before surgery? . Check your blood sugar at least 4 times a day, starting 2 days before surgery, to make sure that the level is not too high or low. o Check your blood sugar the morning of your surgery when you wake up and every 2 hours until you get to the Short Stay unit. . If your blood sugar is less than 70 mg/dL, you will need to treat for low blood sugar: o Do not take insulin. o Treat a low blood sugar (less than 70 mg/dL) with  cup of clear juice (cranberry or apple), 4 glucose tablets, OR glucose gel. o Recheck blood sugar in 15 minutes after treatment (to make sure it is greater than 70 mg/dL). If your blood sugar is not greater than 70 mg/dL on recheck, call [phone] for  further instructions. . Report your blood sugar to the short stay nurse when you get to Short Stay.  . If you are admitted to the hospital after surgery: o Your blood sugar will be checked by the staff and you will probably be given insulin after surgery (instead of oral diabetes medicines) to make sure you have good blood sugar levels. o The goal for blood sugar control after surgery is 80-180 mg/dL.   WHAT DO I DO ABOUT MY DIABETES MEDICATION?   . THE DAY BEFORE SURGERY o  take  Morning and/or noon dose of GLIPIZIDE . Do NOT take evening/bedtime dose. o Take METFORMIN as normal.       . THE MORNING OF SURGERY, DO NOT TAKE ANY DIABETIC MEDICATIONS DAY OF YOUR SURGERY   Patient Signature:  Date:   Nurse Signature:  Date:   Reviewed and Endorsed by [place] Patient Education Committee, August 2015   [facility] - Preparing for Surgery Before surgery, you can play an important role.  Because skin is not sterile, your skin needs to be as free of germs as possible.  You can reduce the number of germs on your skin by washing with CHG (chlorahexidine gluconate) soap  before surgery.  CHG is an antiseptic cleaner which kills germs and bonds with the skin to continue killing germs even after washing. Please DO NOT use if you have an allergy to CHG or antibacterial soaps.  If your skin becomes reddened/irritated stop using the CHG and inform your nurse when you arrive at Short Stay. Do not shave (including legs and underarms) for at least 48 hours prior to the first CHG shower.  You may shave your face/neck. Please follow these instructions carefully:  1.  Shower with CHG Soap the night before surgery and the  morning of Surgery.  2.  If you choose to wash your hair, wash your hair first as usual with your  normal  shampoo.  3.  After you shampoo, rinse your hair and body thoroughly to remove the  shampoo.                           4.  Use CHG as you would any other liquid soap.  You can apply chg directly  to the skin and wash                       Gently with a scrungie or clean washcloth.  5.  Apply the CHG Soap to your body ONLY FROM THE NECK DOWN.   Do not use on face/ open                           Wound or open sores. Avoid contact with eyes, ears mouth and genitals (private parts).                       Wash face,  Genitals (private parts) with your normal soap.             6.  Wash thoroughly, paying special attention to the area where your surgery  will be performed.  7.  Thoroughly rinse your body with warm water from the neck down.  8.  DO NOT shower/wash with your normal soap after using and rinsing off  the CHG Soap.  9.  Pat yourself dry with a clean towel.            10.  Wear clean pajamas.            11.  Place clean sheets on your bed the night of your first shower and do not  sleep with pets. Day of Surgery : Do not apply any lotions/deodorants the morning of surgery.  Please wear clean clothes to the hospital/surgery center.  FAILURE TO FOLLOW THESE INSTRUCTIONS MAY RESULT IN THE CANCELLATION OF YOUR SURGERY PATIENT  SIGNATURE_________________________________  NURSE SIGNATURE__________________________________  ________________________________________________________________________

## 2017-10-21 NOTE — Progress Notes (Addendum)
Cardiac clearance Dr Quay Burow 09-21-17 epic  [place] cardiology Dr Gwenlyn Found 03-04-17 epic   EKG 03-04-17 epic

## 2017-10-22 ENCOUNTER — Inpatient Hospital Stay (HOSPITAL_COMMUNITY)
Admission: RE | Admit: 2017-10-22 | Discharge: 2017-10-22 | Disposition: A | Discharge status: Home / Self Care | Payer: Medicare HMO | Source: Ambulatory Visit

## 2017-10-22 NOTE — Progress Notes (Addendum)
EKG 03-04-17 EPIC LOV DR BERRY 03-04-17 CARDIOLOGY EPIC  CARDIAC CLEARANCE NOTE DR BERRY 09-21-17 EPIC FOR 10-27-17 SURGERY  LOV DR DAVID MEDCIAL MD 08-20-17 ON CHART

## 2017-10-22 NOTE — Progress Notes (Signed)
Please place orders in EPIC as patient has a pre-op appointment on 10/23/2017! Thank you!

## 2017-10-22 NOTE — Patient Instructions (Addendum)
NOHA KARASIK  10/22/2017   Your procedure is scheduled on: Tuesday 10-27-17  Report to [facility]  Entrance Take [place]  elevators to 3rd floor to  [place] at 915 AM.   Call this number if you have problems the morning of surgery [phone]   Remember: ONLY 1 PERSON MAY GO WITH YOU TO SHORT STAY TO GET  READY MORNING OF [place].  Do not eat food or drink liquids :After Midnight.     Take these medicines the morning of surgery with A SIP OF WATER: tylenolol if needed, eye drop if needed, diltiaze, (dilacor xr), gabapentin(neurontin), levothyroxine (synthroid), omeprazole (prilosec), tamsulosin (flomax)  DO NOT TAKE ANY DIABETIC MEDICATIONS DAY OF YOUR SURGERY                               You may not have any metal on your body including hair pins and              piercings  Do not wear jewelry, make-up, lotions, powders or perfumes, deodorant             Do not wear nail polish.  Do not shave  48 hours prior to surgery.              Men may shave face and neck.   Do not bring valuables to the hospital. [place].  Contacts, dentures or bridgework may not be worn into surgery.  Leave suitcase in the car. After surgery it may be brought to your room.     Patients discharged the day of surgery will not be allowed to drive home.  Name and phone number of your driver: sister dymir neeson cell [phone]  Special Instructions: N/A              Please read over the following fact sheets you were given: _____________________________________________________________________             How to Manage Your Diabetes Before and After Surgery  Why is it important to control my blood sugar before and after surgery? . Improving blood sugar levels before and after surgery helps healing and can limit problems. . A way of improving blood sugar control is eating a healthy diet by: o  Eating less  sugar and carbohydrates o  Increasing activity/exercise o  Talking with your doctor about reaching your blood sugar goals . High blood sugars (greater than 180 mg/dL) can raise your risk of infections and slow your recovery, so you will need to focus on controlling your diabetes during the weeks before surgery. . Make sure that the doctor who takes care of your diabetes knows about your planned surgery including the date and location.  How do I manage my blood sugar before surgery? . Check your blood sugar at least 4 times a day, starting 2 days before surgery, to make sure that the level is not too high or low. o Check your blood sugar the morning of your surgery when you wake up and every 2 hours until you get to the Short Stay unit. . If your blood sugar is less than 70 mg/dL, you will need to treat for low blood sugar: o Do not take insulin.  o Treat a low blood sugar (less than 70 mg/dL) with  cup of clear juice (cranberry or apple), 4 glucose tablets, OR glucose gel. o Recheck blood sugar in 15 minutes after treatment (to make sure it is greater than 70 mg/dL). If your blood sugar is not greater than 70 mg/dL on recheck, call [phone] for further instructions. . Report your blood sugar to the short stay nurse when you get to Short Stay.  . If you are admitted to the hospital after surgery: o Your blood sugar will be checked by the staff and you will probably be given insulin after surgery (instead of oral diabetes medicines) to make sure you have good blood sugar levels. o The goal for blood sugar control after surgery is 80-180 mg/dL.   WHAT DO I DO ABOUT MY DIABETES MEDICATION?  Marland Kitchen .  . THE DAY BEFORE SURGERY 10-26-17 MORNING DOSE ONLY OF YOUR GLIPIZIDE . TAKE YOUR METFORMIN AS USUAL DAY BEFORE SURGERY 10-26-17       . THE MORNING OF SURGERY, DO NOT TAKE YOUR GLIPIZIDE OR METFORMIN  Patient Signature:  Date:   Nurse Signature:  Date:   Reviewed and Endorsed by [facility]  Patient Education Committee, August 2015[facility] - Preparing for Surgery Before surgery, you can play an important role.  Because skin is not sterile, your skin needs to be as free of germs as possible.  You can reduce the number of germs on your skin by washing with CHG (chlorahexidine gluconate) soap before surgery.  CHG is an antiseptic cleaner which kills germs and bonds with the skin to continue killing germs even after washing. Please DO NOT use if you have an allergy to CHG or antibacterial soaps.  If your skin becomes reddened/irritated stop using the CHG and inform your nurse when you arrive at Short Stay. Do not shave (including legs and underarms) for at least 48 hours prior to the first CHG shower.  You may shave your face/neck. Please follow these instructions carefully:  1.  Shower with CHG Soap the night before surgery and the  morning of Surgery.  2.  If you choose to wash your hair, wash your hair first as usual with your  normal  shampoo.  3.  After you shampoo, rinse your hair and body thoroughly to remove the  shampoo.                           4.  Use CHG as you would any other liquid soap.  You can apply chg directly  to the skin and wash                       Gently with a scrungie or clean washcloth.  5.  Apply the CHG Soap to your body ONLY FROM THE NECK DOWN.   Do not use on face/ open                           Wound or open sores. Avoid contact with eyes, ears mouth and genitals (private parts).                       Wash face,  Genitals (private parts) with your normal soap.             6.  Wash thoroughly, paying special attention to the area where your surgery  will be performed.  7.  Thoroughly rinse your body with warm water from the neck down.  8.  DO NOT shower/wash with your normal soap after using and rinsing off  the CHG Soap.                9.  Pat yourself dry with a clean towel.            10.  Wear clean pajamas.            11.  Place clean sheets on your  bed the night of your first shower and do not  sleep with pets. Day of Surgery : Do not apply any lotions/deodorants the morning of surgery.  Please wear clean clothes to the hospital/surgery center.    ________________________________________________________________________

## 2017-10-23 ENCOUNTER — Encounter (INDEPENDENT_AMBULATORY_CARE_PROVIDER_SITE_OTHER): Payer: Self-pay

## 2017-10-23 ENCOUNTER — Encounter (HOSPITAL_COMMUNITY): Payer: Self-pay

## 2017-10-23 ENCOUNTER — Encounter (HOSPITAL_COMMUNITY)
Admission: RE | Admit: 2017-10-23 | Discharge: 2017-10-23 | Disposition: A | Discharge status: Home / Self Care | Payer: Medicare HMO | Source: Ambulatory Visit | Attending: General Surgery | Admitting: General Surgery

## 2017-10-23 DIAGNOSIS — K469 Unspecified abdominal hernia without obstruction or gangrene: Secondary | ICD-10-CM | POA: Insufficient documentation

## 2017-10-23 DIAGNOSIS — Z01812 Encounter for preprocedural laboratory examination: Secondary | ICD-10-CM | POA: Diagnosis present

## 2017-10-23 HISTORY — DX: Unspecified hearing loss, unspecified ear: H91.90

## 2017-10-23 HISTORY — DX: Hypothyroidism, unspecified: E03.9

## 2017-10-23 HISTORY — DX: Unilateral inguinal hernia, without obstruction or gangrene, not specified as recurrent: K40.90

## 2017-10-23 HISTORY — DX: Diplopia: H53.2

## 2017-10-23 LAB — BASIC METABOLIC PANEL
Anion gap: 7 (ref 5–15)
BUN: 16 mg/dL (ref 6–20)
CHLORIDE: 111 mmol/L (ref 101–111)
CO2: 23 mmol/L (ref 22–32)
CREATININE: 1.26 mg/dL — AB (ref 0.61–1.24)
Calcium: 9.2 mg/dL (ref 8.9–10.3)
GFR calc Af Amer: >60 mL/min (ref >60)
GFR calc non Af Amer: 53 mL/min — ABNORMAL LOW (ref >60)
GLUCOSE: 107 mg/dL — AB (ref 65–99)
Potassium: 4.9 mmol/L (ref 3.5–5.1)
SODIUM: 141 mmol/L (ref 135–145)

## 2017-10-23 LAB — CBC
HCT: 35 % — ABNORMAL LOW (ref 39.0–52.0)
Hemoglobin: 10.9 g/dL — ABNORMAL LOW (ref 13.0–17.0)
MCH: 25.4 pg — ABNORMAL LOW (ref 26.0–34.0)
MCHC: 31.1 g/dL (ref 30.0–36.0)
MCV: 81.6 fL (ref 78.0–100.0)
PLATELETS: 239 10*3/uL (ref 150–400)
RBC: 4.29 MIL/uL (ref 4.22–5.81)
RDW: 17.2 % — AB (ref 11.5–15.5)
WBC: 6.5 10*3/uL (ref 4.0–10.5)

## 2017-10-23 LAB — GLUCOSE, CAPILLARY: GLUCOSE-CAPILLARY: 120 mg/dL — AB (ref 65–99)

## 2017-10-23 LAB — HEMOGLOBIN A1C
Hgb A1c MFr Bld: 7 % — ABNORMAL HIGH (ref 4.8–5.6)
MEAN PLASMA GLUCOSE: 154.2 mg/dL

## 2017-10-26 ENCOUNTER — Ambulatory Visit: Payer: Self-pay | Admitting: General Surgery

## 2017-10-27 ENCOUNTER — Ambulatory Visit (HOSPITAL_BASED_OUTPATIENT_CLINIC_OR_DEPARTMENT_OTHER)
Admission: RE | Admit: 2017-10-27 | Discharge: 2017-10-27 | Disposition: A | Discharge status: Home / Self Care | Payer: Medicare HMO | Source: Ambulatory Visit | Attending: General Surgery | Admitting: General Surgery

## 2017-10-27 ENCOUNTER — Ambulatory Visit (HOSPITAL_COMMUNITY): Payer: Medicare HMO | Admitting: Anesthesiology

## 2017-10-27 ENCOUNTER — Encounter (HOSPITAL_COMMUNITY): Payer: Self-pay

## 2017-10-27 ENCOUNTER — Encounter (HOSPITAL_COMMUNITY)
Admission: RE | Disposition: A | Discharge status: Home / Self Care | Payer: Self-pay | Source: Ambulatory Visit | Attending: General Surgery

## 2017-10-27 DIAGNOSIS — I714 Abdominal aortic aneurysm, without rupture: Secondary | ICD-10-CM | POA: Insufficient documentation

## 2017-10-27 DIAGNOSIS — Z85828 Personal history of other malignant neoplasm of skin: Secondary | ICD-10-CM | POA: Insufficient documentation

## 2017-10-27 DIAGNOSIS — E039 Hypothyroidism, unspecified: Secondary | ICD-10-CM | POA: Diagnosis not present

## 2017-10-27 DIAGNOSIS — I10 Essential (primary) hypertension: Secondary | ICD-10-CM | POA: Insufficient documentation

## 2017-10-27 DIAGNOSIS — K4090 Unilateral inguinal hernia, without obstruction or gangrene, not specified as recurrent: Secondary | ICD-10-CM | POA: Diagnosis present

## 2017-10-27 DIAGNOSIS — Z79899 Other long term (current) drug therapy: Secondary | ICD-10-CM | POA: Diagnosis not present

## 2017-10-27 DIAGNOSIS — I739 Peripheral vascular disease, unspecified: Secondary | ICD-10-CM | POA: Insufficient documentation

## 2017-10-27 DIAGNOSIS — H532 Diplopia: Secondary | ICD-10-CM | POA: Insufficient documentation

## 2017-10-27 DIAGNOSIS — N400 Enlarged prostate without lower urinary tract symptoms: Secondary | ICD-10-CM | POA: Diagnosis not present

## 2017-10-27 DIAGNOSIS — Z8041 Family history of malignant neoplasm of ovary: Secondary | ICD-10-CM | POA: Diagnosis not present

## 2017-10-27 DIAGNOSIS — H9193 Unspecified hearing loss, bilateral: Secondary | ICD-10-CM | POA: Diagnosis not present

## 2017-10-27 DIAGNOSIS — Z7984 Long term (current) use of oral hypoglycemic drugs: Secondary | ICD-10-CM | POA: Diagnosis not present

## 2017-10-27 DIAGNOSIS — E119 Type 2 diabetes mellitus without complications: Secondary | ICD-10-CM | POA: Diagnosis not present

## 2017-10-27 DIAGNOSIS — E785 Hyperlipidemia, unspecified: Secondary | ICD-10-CM | POA: Diagnosis not present

## 2017-10-27 DIAGNOSIS — Z885 Allergy status to narcotic agent status: Secondary | ICD-10-CM | POA: Insufficient documentation

## 2017-10-27 DIAGNOSIS — M21939 Unspecified acquired deformity of unspecified forearm: Secondary | ICD-10-CM | POA: Diagnosis not present

## 2017-10-27 DIAGNOSIS — Z888 Allergy status to other drugs, medicaments and biological substances status: Secondary | ICD-10-CM | POA: Diagnosis not present

## 2017-10-27 DIAGNOSIS — M1990 Unspecified osteoarthritis, unspecified site: Secondary | ICD-10-CM | POA: Diagnosis not present

## 2017-10-27 DIAGNOSIS — Z87891 Personal history of nicotine dependence: Secondary | ICD-10-CM | POA: Insufficient documentation

## 2017-10-27 DIAGNOSIS — Z800 Family history of malignant neoplasm of digestive organs: Secondary | ICD-10-CM | POA: Diagnosis not present

## 2017-10-27 DIAGNOSIS — Z7982 Long term (current) use of aspirin: Secondary | ICD-10-CM | POA: Diagnosis not present

## 2017-10-27 DIAGNOSIS — G8918 Other acute postprocedural pain: Secondary | ICD-10-CM | POA: Diagnosis not present

## 2017-10-27 HISTORY — PX: INSERTION OF MESH: SHX5868

## 2017-10-27 HISTORY — PX: INGUINAL HERNIA REPAIR: SHX194

## 2017-10-27 LAB — GLUCOSE, CAPILLARY
GLUCOSE-CAPILLARY: 130 mg/dL — AB (ref 65–99)
GLUCOSE-CAPILLARY: 197 mg/dL — AB (ref 65–99)

## 2017-10-27 SURGERY — REPAIR, HERNIA, INGUINAL, ADULT
Anesthesia: General | Site: Groin | Laterality: Left

## 2017-10-27 MED ORDER — 0.9 % SODIUM CHLORIDE (POUR BTL) OPTIME
TOPICAL | Status: DC | PRN
  Administered 2017-10-27: 1000 mL

## 2017-10-27 MED ORDER — SUGAMMADEX SODIUM 500 MG/5ML IV SOLN
INTRAVENOUS | Status: AC
  Filled 2017-10-27: qty 5

## 2017-10-27 MED ORDER — SUGAMMADEX SODIUM 200 MG/2ML IV SOLN
INTRAVENOUS | Status: DC | PRN
  Administered 2017-10-27: 200 mg via INTRAVENOUS

## 2017-10-27 MED ORDER — CEFAZOLIN SODIUM-DEXTROSE 2-4 GM/100ML-% IV SOLN
2.0000 g | INTRAVENOUS | Status: AC
  Administered 2017-10-27: 2 g via INTRAVENOUS
  Filled 2017-10-27: qty 100

## 2017-10-27 MED ORDER — FENTANYL CITRATE (PF) 100 MCG/2ML IJ SOLN
INTRAMUSCULAR | Status: AC
  Administered 2017-10-27: 50 ug via INTRAVENOUS
  Filled 2017-10-27: qty 2

## 2017-10-27 MED ORDER — FENTANYL CITRATE (PF) 100 MCG/2ML IJ SOLN
INTRAMUSCULAR | Status: AC
  Filled 2017-10-27: qty 2

## 2017-10-27 MED ORDER — CHLORHEXIDINE GLUCONATE CLOTH 2 % EX PADS: 6.0000 | MEDICATED_PAD | Freq: Once | CUTANEOUS | Status: DC

## 2017-10-27 MED ORDER — TRAMADOL HCL 50 MG PO TABS: 50.0000 mg | ORAL_TABLET | Freq: Four times a day (QID) | ORAL | 0 refills | Status: DC | PRN

## 2017-10-27 MED ORDER — EPHEDRINE SULFATE-NACL 50-0.9 MG/10ML-% IV SOSY
PREFILLED_SYRINGE | INTRAVENOUS | Status: DC | PRN
  Administered 2017-10-27: 10 mg via INTRAVENOUS
  Administered 2017-10-27: 15 mg via INTRAVENOUS
  Administered 2017-10-27 (×2): 10 mg via INTRAVENOUS

## 2017-10-27 MED ORDER — ONDANSETRON HCL 4 MG/2ML IJ SOLN
INTRAMUSCULAR | Status: AC
  Filled 2017-10-27: qty 2

## 2017-10-27 MED ORDER — LACTATED RINGERS IV SOLN: INTRAVENOUS | Status: DC

## 2017-10-27 MED ORDER — LIDOCAINE 2% (20 MG/ML) 5 ML SYRINGE
INTRAMUSCULAR | Status: AC
  Filled 2017-10-27: qty 5

## 2017-10-27 MED ORDER — MIDAZOLAM HCL 2 MG/2ML IJ SOLN
2.0000 mg | Freq: Once | INTRAMUSCULAR | Status: AC
  Administered 2017-10-27: 1 mg via INTRAVENOUS

## 2017-10-27 MED ORDER — BUPIVACAINE-EPINEPHRINE 0.25% -1:200000 IJ SOLN
INTRAMUSCULAR | Status: DC | PRN
  Administered 2017-10-27: 40 mL

## 2017-10-27 MED ORDER — ROCURONIUM BROMIDE 10 MG/ML (PF) SYRINGE
PREFILLED_SYRINGE | INTRAVENOUS | Status: DC | PRN
  Administered 2017-10-27: 40 mg via INTRAVENOUS
  Administered 2017-10-27: 10 mg via INTRAVENOUS

## 2017-10-27 MED ORDER — DEXAMETHASONE SODIUM PHOSPHATE 10 MG/ML IJ SOLN
INTRAMUSCULAR | Status: AC
  Filled 2017-10-27: qty 1

## 2017-10-27 MED ORDER — FENTANYL CITRATE (PF) 100 MCG/2ML IJ SOLN: 25.0000 ug | INTRAMUSCULAR | Status: DC | PRN

## 2017-10-27 MED ORDER — SUCCINYLCHOLINE CHLORIDE 200 MG/10ML IV SOSY
PREFILLED_SYRINGE | INTRAVENOUS | Status: DC | PRN
  Administered 2017-10-27: 120 mg via INTRAVENOUS

## 2017-10-27 MED ORDER — LIDOCAINE 2% (20 MG/ML) 5 ML SYRINGE
INTRAMUSCULAR | Status: DC | PRN
  Administered 2017-10-27: 100 mg via INTRAVENOUS

## 2017-10-27 MED ORDER — MIDAZOLAM HCL 2 MG/2ML IJ SOLN
INTRAMUSCULAR | Status: AC
  Administered 2017-10-27: 1 mg via INTRAVENOUS
  Filled 2017-10-27: qty 2

## 2017-10-27 MED ORDER — PROMETHAZINE HCL 25 MG/ML IJ SOLN: 6.2500 mg | INTRAMUSCULAR | Status: DC | PRN

## 2017-10-27 MED ORDER — SUCCINYLCHOLINE CHLORIDE 200 MG/10ML IV SOSY
PREFILLED_SYRINGE | INTRAVENOUS | Status: AC
  Filled 2017-10-27: qty 10

## 2017-10-27 MED ORDER — FENTANYL CITRATE (PF) 100 MCG/2ML IJ SOLN
100.0000 ug | Freq: Once | INTRAMUSCULAR | Status: AC
  Administered 2017-10-27: 50 ug via INTRAVENOUS

## 2017-10-27 MED ORDER — GABAPENTIN 300 MG PO CAPS
300.0000 mg | ORAL_CAPSULE | ORAL | Status: DC
  Filled 2017-10-27: qty 1

## 2017-10-27 MED ORDER — ONDANSETRON HCL 4 MG/2ML IJ SOLN
INTRAMUSCULAR | Status: DC | PRN
  Administered 2017-10-27: 4 mg via INTRAVENOUS

## 2017-10-27 MED ORDER — ROCURONIUM BROMIDE 50 MG/5ML IV SOSY
PREFILLED_SYRINGE | INTRAVENOUS | Status: AC
  Filled 2017-10-27: qty 5

## 2017-10-27 MED ORDER — PROPOFOL 10 MG/ML IV BOLUS
INTRAVENOUS | Status: AC
  Filled 2017-10-27: qty 20

## 2017-10-27 MED ORDER — BUPIVACAINE-EPINEPHRINE (PF) 0.5% -1:200000 IJ SOLN
INTRAMUSCULAR | Status: DC | PRN
  Administered 2017-10-27: 30 mL via PERINEURAL

## 2017-10-27 MED ORDER — PROPOFOL 10 MG/ML IV BOLUS
INTRAVENOUS | Status: DC | PRN
  Administered 2017-10-27: 150 mg via INTRAVENOUS

## 2017-10-27 MED ORDER — MEPERIDINE HCL 50 MG/ML IJ SOLN: 6.2500 mg | INTRAMUSCULAR | Status: DC | PRN

## 2017-10-27 MED ORDER — BUPIVACAINE-EPINEPHRINE 0.25% -1:200000 IJ SOLN
INTRAMUSCULAR | Status: AC
  Filled 2017-10-27: qty 1

## 2017-10-27 MED ORDER — DEXAMETHASONE SODIUM PHOSPHATE 10 MG/ML IJ SOLN
INTRAMUSCULAR | Status: DC | PRN
  Administered 2017-10-27: 10 mg via INTRAVENOUS

## 2017-10-27 MED ORDER — LACTATED RINGERS IV SOLN
INTRAVENOUS | Status: DC
  Administered 2017-10-27: 12:00:00 via INTRAVENOUS
  Administered 2017-10-27: 1000 mL via INTRAVENOUS

## 2017-10-27 MED ORDER — FENTANYL CITRATE (PF) 100 MCG/2ML IJ SOLN
INTRAMUSCULAR | Status: DC | PRN
  Administered 2017-10-27: 50 ug via INTRAVENOUS

## 2017-10-27 MED ORDER — SUGAMMADEX SODIUM 200 MG/2ML IV SOLN
INTRAVENOUS | Status: AC
  Filled 2017-10-27: qty 2

## 2017-10-27 MED ORDER — EPHEDRINE 5 MG/ML INJ
INTRAVENOUS | Status: AC
  Filled 2017-10-27: qty 10

## 2017-10-27 MED ORDER — ACETAMINOPHEN 500 MG PO TABS
1000.0000 mg | ORAL_TABLET | ORAL | Status: AC
  Administered 2017-10-27: 1000 mg via ORAL
  Filled 2017-10-27: qty 2

## 2017-10-27 SURGICAL SUPPLY — 25 items
BENZOIN TINCTURE PRP APPL 2/3 (GAUZE/BANDAGES/DRESSINGS) ×3 IMPLANT
CLOSURE WOUND 1/2 X4 (GAUZE/BANDAGES/DRESSINGS) ×1
COVER SURGICAL LIGHT HANDLE (MISCELLANEOUS) ×3 IMPLANT
DECANTER SPIKE VIAL GLASS SM (MISCELLANEOUS) IMPLANT
DRAIN PENROSE 18X1/2 LTX STRL (DRAIN) ×3 IMPLANT
DRAPE LAPAROTOMY T 98X78 PEDS (DRAPES) ×3 IMPLANT
DRSG TEGADERM 4X4.75 (GAUZE/BANDAGES/DRESSINGS) ×3 IMPLANT
ELECT REM PT RETURN 15FT ADLT (MISCELLANEOUS) ×3 IMPLANT
GAUZE SPONGE 4X4 12PLY STRL (GAUZE/BANDAGES/DRESSINGS) ×3 IMPLANT
GLOVE BIO SURGEON STRL SZ7.5 (GLOVE) ×3 IMPLANT
GLOVE INDICATOR 8.0 STRL GRN (GLOVE) ×3 IMPLANT
GOWN STRL REUS W/TWL XL LVL3 (GOWN DISPOSABLE) ×6 IMPLANT
KIT BASIN OR (CUSTOM PROCEDURE TRAY) ×3 IMPLANT
MESH ULTRAPRO 3X6 7.6X15CM (Mesh General) ×3 IMPLANT
NEEDLE HYPO 22GX1.5 SAFETY (NEEDLE) ×3 IMPLANT
PACK GENERAL/GYN (CUSTOM PROCEDURE TRAY) ×3 IMPLANT
STRIP CLOSURE SKIN 1/2X4 (GAUZE/BANDAGES/DRESSINGS) ×2 IMPLANT
SUT MNCRL AB 4-0 PS2 18 (SUTURE) ×3 IMPLANT
SUT PROLENE 2 0 CT2 30 (SUTURE) ×6 IMPLANT
SUT VIC AB 2-0 SH 27 (SUTURE) ×2
SUT VIC AB 2-0 SH 27X BRD (SUTURE) ×1 IMPLANT
SUT VIC AB 3-0 SH 18 (SUTURE) ×3 IMPLANT
SYR 20CC LL (SYRINGE) ×3 IMPLANT
TOWEL OR 17X26 10 PK STRL BLUE (TOWEL DISPOSABLE) ×3 IMPLANT
TOWEL OR NON WOVEN STRL DISP B (DISPOSABLE) ×3 IMPLANT

## 2017-10-27 NOTE — H&P (Signed)
Dillon Wagner is an 76 y.o. male.   Chief Complaint: here for surgery HPI: 76 yo male presents for elective open repair of LIH.   The patient is a 76 year old male who presents with an inguinal hernia. He is referred by Dr. Rosana Wagner for concerns of a left inguinal hernia. The patient states that he has noticed a lump for several months in his left groin. He will have intermittent discomfort. He denies any burning, shooting, or stabbing pain. At times he will have lower abdominal cramps then he will have several bowel movements which will alleviate the discomfort both in the lower abdomen and in the groin. Otherwise she has daily bowel movements. He denies any melena or hematochezia. He has had a prior midline appendectomy as well as an aortic biiliac stent. He is a former smoker. He does have some BPH symptoms but takes Flomax. Dr. Gwenlyn Wagner is his cardiologist. He denies any nausea, vomiting, diarrhea.  Past Medical History:  Diagnosis Date  . AAA (abdominal aortic aneurysm) (HCC)    4.9 x 4.6 cm  . Arthritis   . Cancer ([place])    skin ca  removed  . Diabetes ([place])    type 2  . Double vision    right eye  . History of stress test 05/04/2012   negative stress test  . HOH (hard of hearing)    both ears  . Hyperlipidemia   . Hypertension   . Hypothyroidism   . Left inguinal hernia   . S/P wrist surgery 09/11/1958   right wrist remove    Past Surgical History:  Procedure Laterality Date  . ABDOMINAL AORTIC ENDOVASCULAR STENT GRAFT N/A 04/17/2016   Procedure: ABDOMINAL AORTIC ENDOVASCULAR STENT GRAFT;  Surgeon: Serafina Mitchell, MD;  Location: [facility];  Service: Vascular;  Laterality: N/A;  . [place]  . BACK SURGERY  2004   lower  . ELBOW SURGERY Left 1957   Pins  . EYE SURGERY Bilateral    ioc for cataracts  . 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 NECK SURGERY  05/2003   Dr Consuello Masse  . NM MYOCAR PERF WALL MOTION  05/04/2012   protocol:Lexiscan, lung  to heart ratio 38%, post EF 67%, small fixed apical defect  . SHOULDER SURGERY Left 10/31/2012   shoulder replaced   last surgery total of 4 surgeries  . stomach sugery  1969   Infected fat on left side  . thumb surgery Right 01/1995    Family History  Problem Relation Age of Onset  . Cancer Mother 62       ovarian  . Cancer Father 89       cancer in the stomach   Social History:  reports that he quit smoking about 31 years ago. His smoking use included Cigarettes. He has a 56.00 pack-year smoking history. He quit smokeless tobacco use about 30 years ago. He reports that he does not drink alcohol or use drugs.  Allergies:  Allergies  Allergen Reactions  . Morphine And Related Other (See Comments)    Altered mental status (did not recognize spouse)  . Buprenorphine Hcl     Other reaction(s): Other (See Comments) Altered mental status (did not recognize spouse)  . Codeine Nausea And Vomiting    Medications Prior to Admission  Medication Sig Dispense Refill  . acetaminophen (TYLENOL) 500 MG tablet Take 1,000 mg by mouth every 8 (eight) hours as needed for mild pain.    Marland Kitchen  Artificial Tear Solution (GENTEAL TEARS OP) Apply 1 drop to eye 2 (two) times daily as needed (dry eyes).    Marland Kitchen aspirin 81 MG tablet Take 81 mg by mouth daily.    . diclofenac (VOLTAREN) 75 MG EC tablet Take 75 mg by mouth daily.    Marland Kitchen diltiazem (DILACOR XR) 240 MG 24 hr capsule Take 240 mg by mouth daily.    Marland Kitchen gabapentin (NEURONTIN) 600 MG tablet Take 600 mg by mouth 2 (two) times daily.    Marland Kitchen glipiZIDE (GLUCOTROL) 5 MG tablet Take 7.5 mg by mouth 2 (two) times daily before a meal. Verified takes 1.5 tablets 2 times daily    . levothyroxine (SYNTHROID, LEVOTHROID) 75 MCG tablet Take 50 mcg by mouth daily before breakfast.     . loratadine (CLARITIN) 10 MG tablet Take 10 mg by mouth daily.    . metFORMIN (GLUCOPHAGE) 1000 MG tablet Take 1 tablet (1,000 mg total) by mouth 2 (two) times daily with a meal. (Patient  taking differently: Take 500 mg by mouth 2 (two) times daily with a meal. ) 30 tablet 0  . metoprolol succinate (TOPROL-XL) 100 MG 24 hr tablet Take 100 mg by mouth daily with supper. Take with or immediately following a meal.     . omeprazole (PRILOSEC) 20 MG capsule Take 20 mg by mouth daily with supper.     . simvastatin (ZOCOR) 20 MG tablet Take 20 mg by mouth daily with supper.     . tamsulosin (FLOMAX) 0.4 MG CAPS Take 0.4 mg by mouth daily after breakfast.      Results for orders placed or performed during the hospital encounter of 10/27/17 (from the past 48 hour(s))  Glucose, capillary     Status: Abnormal   Collection Time: 10/27/17  9:00 AM  Result Value Ref Range   Glucose-Capillary 130 (H) 65 - 99 mg/dL   No results Wagner.  Review of Systems  Constitutional: Negative for weight loss.  HENT: Negative for nosebleeds.   Eyes: Negative for blurred vision.  Respiratory: Negative for shortness of breath.   Cardiovascular: Negative for chest pain, palpitations, orthopnea and PND.       Denies DOE  Genitourinary: Negative for dysuria and hematuria.  Musculoskeletal: Negative.   Skin: Negative for itching and rash.  Neurological: Negative for dizziness, focal weakness, seizures, loss of consciousness and headaches.       Denies TIAs, amaurosis fugax  Endo/Heme/Allergies: Does not bruise/bleed easily.  Psychiatric/Behavioral: The patient is not nervous/anxious.     Blood pressure 128/68, pulse (!) 54, temperature 98.6 F (37 C), temperature source Oral, resp. rate 15, height 5\' 10"  (1.778 m), weight 84.8 kg (187 lb), SpO2 100 %. Physical Exam  Vitals reviewed. Constitutional: He is oriented to person, place, and time. He appears well-developed and well-nourished. No distress.  HENT:  Head: Normocephalic and atraumatic.  Right Ear: External ear normal.  Left Ear: External ear normal.  Eyes: Conjunctivae are normal. No scleral icterus.  Neck: Normal range of motion. Neck  supple. No tracheal deviation present. No thyromegaly present.  Cardiovascular: Normal rate and normal heart sounds.   Respiratory: Effort normal and breath sounds normal. No stridor. No respiratory distress. He has no wheezes.  GI: Soft. There is no tenderness. There is no rebound and no guarding. A hernia is present. Hernia confirmed positive in the left inguinal area.  Old lower abd incisions   Musculoskeletal: He exhibits no edema or tenderness.  Lymphadenopathy:    He  has no cervical adenopathy.  Neurological: He is alert and oriented to person, place, and time. He exhibits normal muscle tone.  Skin: Skin is warm and dry. No rash noted. He is not diaphoretic. No erythema. No pallor.  Psychiatric: He has a normal mood and affect. His behavior is normal. Judgment and thought content normal.     Assessment/Plan Left inguinal hernia H/o AAA DM HTN HLD  To or for open repair of LIH repair with mesh TAP block/eras meds All questions asked and answered  Leighton Ruff. Redmond Pulling, MD, [place], Bariatric, & Minimally Invasive Surgery [facility], [place]  Gayland Curry, MD 10/27/2017, 10:52 AM

## 2017-10-27 NOTE — Progress Notes (Signed)
Assisted Dr. Hollis with left, ultrasound guided, transabdominal plane block. Side rails up, monitors on throughout procedure. See vital signs in flow sheet. Tolerated Procedure well. 

## 2017-10-27 NOTE — Discharge Instructions (Signed)
[facility]  UMBILICAL OR INGUINAL HERNIA REPAIR: POST OP INSTRUCTIONS  Always review your discharge instruction sheet given to you by the facility where your surgery was performed. IF YOU HAVE DISABILITY OR FAMILY LEAVE FORMS, YOU MUST BRING THEM TO THE OFFICE FOR PROCESSING.   DO NOT GIVE THEM TO YOUR DOCTOR.  1. A  prescription for pain medication may be given to you upon discharge.  Take your pain medication as prescribed, if needed.  If narcotic pain medicine is not needed, then you may take acetaminophen (Tylenol) or ibuprofen (Advil) as needed. 2. Take your usually prescribed medications unless otherwise directed. 3. If you need a refill on your pain medication, please contact your pharmacy.  They will contact our office to request authorization. Prescriptions will not be filled after 5 pm or on week-ends. 4. You should follow a light diet the first 24 hours after arrival home, such as soup and crackers, etc.  Be sure to include lots of fluids daily.  Resume your normal diet the day after surgery. 5. Most patients will experience some swelling and bruising around the umbilicus or in the groin and scrotum.  Ice packs and reclining will help.  Swelling and bruising can take several days to resolve.  6. It is common to experience some constipation if taking pain medication after surgery.  Increasing fluid intake and taking a stool softener (such as Colace) will usually help or prevent this problem from occurring.  A mild laxative (Milk of Magnesia or Miralax) should be taken according to package directions if there are no bowel movements after 48 hours. 7. Unless discharge instructions indicate otherwise, you may remove your bandages 48 hours after surgery, and you may shower at that time.  You  have steri-strips (small skin tapes) in place directly over the incision.  These strips should be left on the skin for 7-10 days.  8. ACTIVITIES:  You may resume regular (light) daily  activities beginning the next day--such as daily self-care, walking, climbing stairs--gradually increasing activities as tolerated.  You may have sexual intercourse when it is comfortable.  Refrain from any heavy lifting or straining until approved by your doctor. a. You may drive when you are no longer taking prescription pain medication, you can comfortably wear a seatbelt, and you can safely maneuver your car and apply brakes. b. RETURN TO WORK:  9. You should see your doctor in the office for a follow-up appointment approximately 2-3 weeks after your surgery.  Make sure that you call for this appointment within a day or two after you arrive home to insure a convenient appointment time. 10. OTHER INSTRUCTIONS: DO NOT LIFT, PUSH, OR PULL ANYTHING GREATER THAN 10 LBS FOR 4 WEEKS    WHEN TO CALL YOUR DOCTOR: 1. Fever over 101.0 2. Inability to urinate 3. Nausea and/or vomiting 4. Extreme swelling or bruising 5. Continued bleeding from incision. 6. Increased pain, redness, or drainage from the incision  The clinic staff is available to answer your questions during regular business hours.  Please dont hesitate to call and ask to speak to one of the nurses for clinical concerns.  If you have a medical emergency, go to the nearest emergency room or call 911.  A surgeon from [facility] is always on call at the hospital   [address] [place], [place], [place]  [address] ?  [address], [place], [place]   [address] [phone] ? [phone] ? FAX [phone]  Web site: [email or web address]   General Anesthesia, Adult, Care After These instructions provide you with information about caring for yourself after your procedure. Your health care provider may also give you more specific instructions. Your treatment has been planned according to current medical practices, but problems sometimes occur. Call your health care provider if you have any problems or  questions after your procedure. What can I expect after the procedure? After the procedure, it is common to have:  Vomiting.  A sore throat.  Mental slowness.  It is common to feel:  Nauseous.  Cold or shivery.  Sleepy.  Tired.  Sore or achy, even in parts of your body where you did not have surgery.  Follow these instructions at home: For at least 24 hours after the procedure:  Do not: ? Participate in activities where you could fall or become injured. ? Drive. ? Use heavy machinery. ? Drink alcohol. ? Take sleeping pills or medicines that cause drowsiness. ? Make important decisions or sign legal documents. ? Take care of children on your own.  Rest. Eating and drinking  If you vomit, drink water, juice, or soup when you can drink without vomiting.  Drink enough fluid to keep your urine clear or pale yellow.  Make sure you have little or no nausea before eating solid foods.  Follow the diet recommended by your health care provider. General instructions  Have a responsible adult stay with you until you are awake and alert.  Return to your normal activities as told by your health care provider. Ask your health care provider what activities are safe for you.  Take over-the-counter and prescription medicines only as told by your health care provider.  If you smoke, do not smoke without supervision.  Keep all follow-up visits as told by your health care provider. This is important. Contact a health care provider if:  You continue to have nausea or vomiting at home, and medicines are not helpful.  You cannot drink fluids or start eating again.  You cannot urinate after 8-12 hours.  You develop a skin rash.  You have fever.  You have increasing redness at the site of your procedure. Get help right away if:  You have difficulty breathing.  You have chest pain.  You have unexpected bleeding.  You feel that you are having a life-threatening or  urgent problem. This information is not intended to replace advice given to you by your health care provider. Make sure you discuss any questions you have with your health care provider. Document Released: 03/23/2001 Document Revised: 05/19/2016 Document Reviewed: 11/29/2015 Elsevier Interactive Patient Education  Nashaun Schein.

## 2017-10-27 NOTE — Anesthesia Postprocedure Evaluation (Signed)
Anesthesia Post Note  Patient: Dillon Wagner  Procedure(s) Performed: OPEN REPAIR OF LEFT INGUINAL HERNA WITH MESH (Left Groin) INSERTION OF MESH (Left Groin)     Patient location during evaluation: PACU Anesthesia Type: General and Regional Level of consciousness: awake and alert Pain management: pain level controlled Vital Signs Assessment: post-procedure vital signs reviewed and stable Respiratory status: spontaneous breathing, nonlabored ventilation, respiratory function stable and patient connected to nasal cannula oxygen Cardiovascular status: blood pressure returned to baseline and stable Postop Assessment: no apparent nausea or vomiting Anesthetic complications: no    Last Vitals:  Vitals:   10/27/17 1325 10/27/17 1401  BP: (!) 113/50 124/63  Pulse: 62 62  Resp: 18 18  Temp: (!) 36.3 C (!) 36.4 C  SpO2: 99% 99%    Last Pain:  Vitals:   10/27/17 1401  TempSrc: Oral  PainSc:                  [name]

## 2017-10-27 NOTE — Anesthesia Preprocedure Evaluation (Addendum)
Anesthesia Evaluation  Patient identified by MRN, date of birth, ID band Patient awake    Reviewed: Allergy & Precautions, NPO status , Patient's Chart, lab work & pertinent test results  Airway Mallampati: III  TM Distance: >3 FB Neck ROM: Full    Dental  (+) Partial Lower, Dental Advisory Given   Pulmonary former smoker,    breath sounds clear to auscultation       Cardiovascular hypertension, Pt. on medications and Pt. on home beta blockers + Peripheral Vascular Disease   Rhythm:Regular Rate:Normal     Neuro/Psych negative neurological ROS     GI/Hepatic Neg liver ROS, GERD  Medicated,  Endo/Other  diabetes, Type 2, Oral Hypoglycemic AgentsHypothyroidism   Renal/GU negative Renal ROS     Musculoskeletal  (+) Arthritis ,   Abdominal   Peds  Hematology negative hematology ROS (+)   Anesthesia Other Findings Periorbital edema present.   Reproductive/Obstetrics                            Lab Results  Component Value Date   WBC 6.5 10/23/2017   HGB 10.9 (L) 10/23/2017   HCT 35.0 (L) 10/23/2017   MCV 81.6 10/23/2017   PLT 239 10/23/2017   Lab Results  Component Value Date   CREATININE 1.26 (H) 10/23/2017   BUN 16 10/23/2017   NA 141 10/23/2017   K 4.9 10/23/2017   CL 111 10/23/2017   CO2 23 10/23/2017   Lab Results  Component Value Date   INR 1.27 04/17/2016   INR 1.09 04/09/2016   EKG: normal sinus rhythm.  Anesthesia Physical Anesthesia Plan  ASA: III  Anesthesia Plan: General   Post-op Pain Management:  Regional for Post-op pain   Induction: Intravenous  PONV Risk Score and Plan: 3 and Ondansetron, Dexamethasone, Midazolam and Treatment may vary due to age or medical condition  Airway Management Planned: Oral ETT  Additional Equipment:   Intra-op Plan:   Post-operative Plan: Extubation in OR  Informed Consent: I have reviewed the patients History and  Physical, chart, labs and discussed the procedure including the risks, benefits and alternatives for the proposed anesthesia with the patient or authorized representative who has indicated his/her understanding and acceptance.   Dental advisory given  Plan Discussed with: CRNA  Anesthesia Plan Comments:        Anesthesia Quick Evaluation

## 2017-10-27 NOTE — Op Note (Signed)
BERKELEY VANAKEN [id number] 1941/08/03   Open LEFT Inguinal Hernia Repair with Mesh Procedure Note  Indications: The patient presented with a history of a left, reducible hernia.    Pre-operative Diagnosis: left reducible inguinal hernia  Post-operative Diagnosis: left reducible indirect inguinal hernia  Surgeon: Gayland Curry   Assistants: none  Anesthesia: General endotracheal anesthesia  Surgeon: Leighton Ruff. Redmond Pulling, MD, FACS  Procedure Details  The patient was seen again in the Holding Room. The risks, benefits, complications, treatment options, and expected outcomes were discussed with the patient. The possibilities of reaction to medication, pulmonary aspiration, perforation of viscus, bleeding, recurrent infection, the need for additional procedures, and development of a complication requiring transfusion or further operation were discussed with the patient and/or family. The likelihood of success in repairing the hernia and returning the patient to their previous functional status is good.  There was concurrence with the proposed plan, and informed consent was obtained. The site of surgery was properly noted/marked. The patient was taken to the Operating Room, identified as [name], and the procedure verified as left inguinal hernia repair. A Time Out was held and the above information confirmed.  The patient was placed in the supine position and underwent induction of anesthesia. The lower abdomen and groin was prepped with Chloraprep and draped in the standard fashion, and 0.25% Marcaine with epinephrine was used to anesthetize the skin over the mid-portion of the inguinal canal. An oblique incision was made. Dissection was carried down through the subcutaneous tissue with cautery to the external oblique fascia.  We opened the external oblique fascia along the direction of its fibers to the external ring.  The spermatic cord was circumferentially dissected bluntly and retracted with a  Penrose drain.  The floor of the inguinal canal was inspected and there was no direct defect.  We skeletonized the spermatic cord and isolated the hernia sac and reduced it.  We used a 3 x 6 inch piece of Ultrapro mesh, which was cut into a keyhole shape.  This was secured with 2-0 Prolene, beginning at the pubic tubercle, running this along the shelving edge inferiorly. Superiorly, the mesh was secured to the internal oblique fascia with interrupted 2-0 Prolene sutures.  The tails of the mesh were sutured together behind the spermatic cord.  The mesh was tucked underneath the external oblique fascia laterally.  The external oblique fascia was reapproximated with 2-0 Vicryl.  3-0 Vicryl was used to close the subcutaneous tissues and 4-0 Monocryl was used to close the skin in subcuticular fashion.  Benzoin and steri-strips were used to seal the incision.  A clean dressing was applied.  The patient was then extubated and brought to the recovery room in stable condition.  All sponge, instrument, and needle counts were correct prior to closure and at the conclusion of the case.   Estimated Blood Loss: Minimal                 Complications: None; patient tolerated the procedure well.         Disposition: PACU - hemodynamically stable.         Condition: stable  Leighton Ruff. Redmond Pulling, MD, FACS General, Bariatric, & Minimally Invasive Surgery [facility], [place]

## 2017-10-27 NOTE — Anesthesia Procedure Notes (Signed)
Anesthesia Regional Block: TAP block   Pre-Anesthetic Checklist: ,, timeout performed, Correct Patient, Correct Site, Correct Laterality, Correct Procedure, Correct Position, site marked, Risks and benefits discussed,  Surgical consent,  Pre-op evaluation,  At surgeon's request and post-op pain management  Laterality: Left  Prep: chloraprep       Needles:  Injection technique: Single-shot  Needle Type: Echogenic Needle     Needle Length: 9cm  Needle Gauge: 21     Additional Needles:   Procedures:,,,, ultrasound used (permanent image in chart),,,,  Narrative:  Start time: 10/27/2017 11:00 AM End time: 10/27/2017 11:10 AM Injection made incrementally with aspirations every 5 mL.  Performed by: Personally  Anesthesiologist: Suella Broad D  Additional Notes: Patient tolerated the procedure well. Local anesthetic introduced in an incremental fashion under minimal resistance after negative aspirations. No paresthesias were elicited. After completion of the procedure, no acute issues were identified and patient continued to be monitored by RN.

## 2017-10-27 NOTE — Anesthesia Procedure Notes (Signed)
Procedure Name: Intubation Date/Time: 10/27/2017 11:16 AM Performed by: Lind Covert Pre-anesthesia Checklist: Patient identified, Emergency Drugs available, Patient being monitored, Timeout performed and Suction available Patient Re-evaluated:Patient Re-evaluated prior to induction Oxygen Delivery Method: Circle system utilized Preoxygenation: Pre-oxygenation with 100% oxygen Induction Type: IV induction Laryngoscope Size: Mac and 4 Grade View: Grade I Tube type: Oral Tube size: 7.5 mm Number of attempts: 1 Airway Equipment and Method: Stylet Placement Confirmation: ETT inserted through vocal cords under direct vision,  positive ETCO2 and breath sounds checked- equal and bilateral Secured at: 22 cm Tube secured with: Tape Dental Injury: Teeth and Oropharynx as per pre-operative assessment

## 2017-10-27 NOTE — Transfer of Care (Signed)
Immediate Anesthesia Transfer of Care Note  Patient: Dillon Wagner  Procedure(s) Performed: OPEN REPAIR OF LEFT INGUINAL HERNA WITH MESH (Left Groin) INSERTION OF MESH (Left Groin)  Patient Location: PACU  Anesthesia Type:General  Level of Consciousness: sedated  Airway & Oxygen Therapy: Patient Spontanous Breathing and Patient connected to face mask oxygen  Post-op Assessment: Report given to RN and Post -op Vital signs reviewed and stable  Post vital signs: Reviewed and stable  Last Vitals:  Vitals:   10/27/17 1102 10/27/17 1103  BP:  118/63  Pulse: (!) 54 (!) 57  Resp: (!) 9 15  Temp:    SpO2: 100% 100%    Last Pain:  Vitals:   10/27/17 0850  TempSrc: Oral      Patients Stated Pain Goal: 4 (65/68/12 7517)  Complications: No apparent anesthesia complications

## 2017-11-30 ENCOUNTER — Encounter: Payer: Self-pay | Admitting: Surgery

## 2017-11-30 ENCOUNTER — Ambulatory Visit (HOSPITAL_COMMUNITY)
Admission: RE | Admit: 2017-11-30 | Discharge: 2017-11-30 | Disposition: A | Discharge status: Home / Self Care | Payer: Medicare HMO | Source: Ambulatory Visit | Attending: Surgery | Admitting: Surgery

## 2017-11-30 ENCOUNTER — Ambulatory Visit (INDEPENDENT_AMBULATORY_CARE_PROVIDER_SITE_OTHER): Payer: Medicare HMO | Admitting: Surgery

## 2017-11-30 VITALS — BP 105/68 | HR 99 | Temp 98.5°F | Resp 20 | Ht 70.0 in | Wt 184.6 lb

## 2017-11-30 DIAGNOSIS — I714 Abdominal aortic aneurysm, without rupture, unspecified: Secondary | ICD-10-CM

## 2017-11-30 NOTE — Progress Notes (Signed)
[facility] [place]  Patient name: Dillon Wagner MRN: [id number] DOB: November 15, 1941 Sex: male   REASON FOR VISIT:    Follow up  [place] ILLNESS:    Dillon Wagner is a 76 y.o. male returns today for follow-up.  He is status post endovascular repair of a 5.9 cm infrarenal abdominal aortic aneurysm on 04/17/2016.  His postoperative course was uncomplicated.  His postoperative CT angiogram showed a good position of the stent graft with no evidence of endoleak   He is back today for follow-up.  He recently had hernia surgery which he is recovering from.  He does complain of some pain in his legs with walking that resolves when he rests.  He will get cramps at night.  PAST MEDICAL HISTORY:   Past Medical History:  Diagnosis Date  . AAA (abdominal aortic aneurysm) (HCC)    4.9 x 4.6 cm  . Arthritis   . Cancer ([place])    skin ca  removed  . Diabetes ([place])    type 2  . Double vision    right eye  . History of stress test 05/04/2012   negative stress test  . HOH (hard of hearing)    both ears  . Hyperlipidemia   . Hypertension   . Hypothyroidism   . Left inguinal hernia   . S/P wrist surgery 09/11/1958   right wrist remove     FAMILY HISTORY:   Family History  Problem Relation Age of Onset  . Cancer Mother 59       ovarian  . Cancer Father 29       cancer in the stomach    SOCIAL HISTORY:   Social History   Tobacco Use  . Smoking status: Former Smoker    Packs/day: 2.00    Years: 28.00    Pack years: 56.00    Types: Cigarettes    Last attempt to quit: 04/28/1986    Years since quitting: 31.6  . Smokeless tobacco: Former Systems developer    Quit date: 03/22/1987  Substance Use Topics  . Alcohol use: No    Alcohol/week: 0.0 oz     ALLERGIES:   Allergies  Allergen Reactions  . Morphine And Related Other (See Comments)    Altered mental status (did not recognize spouse)  . Buprenorphine Hcl     Other  reaction(s): Other (See Comments) Altered mental status (did not recognize spouse)  . Codeine Nausea And Vomiting     CURRENT MEDICATIONS:   Current Outpatient Medications  Medication Sig Dispense Refill  . acetaminophen (TYLENOL) 500 MG tablet Take 1,000 mg by mouth every 8 (eight) hours as needed for mild pain.    . Artificial Tear Solution (GENTEAL TEARS OP) Apply 1 drop to eye 2 (two) times daily as needed (dry eyes).    Marland Kitchen aspirin 81 MG tablet Take 81 mg by mouth daily.    . diclofenac (VOLTAREN) 75 MG EC tablet Take 75 mg by mouth daily.    Marland Kitchen diltiazem (DILACOR XR) 240 MG 24 hr capsule Take 240 mg by mouth daily.    Marland Kitchen gabapentin (NEURONTIN) 600 MG tablet Take 600 mg by mouth 2 (two) times daily.    Marland Kitchen glipiZIDE (GLUCOTROL) 5 MG tablet Take 7.5 mg by mouth 2 (two) times daily before a meal. Verified takes 1.5 tablets 2 times daily    . levothyroxine (SYNTHROID, LEVOTHROID) 75 MCG tablet Take 50 mcg by mouth daily before breakfast.     .  loratadine (CLARITIN) 10 MG tablet Take 10 mg by mouth daily.    . metFORMIN (GLUCOPHAGE) 1000 MG tablet Take 1 tablet (1,000 mg total) by mouth 2 (two) times daily with a meal. (Patient taking differently: Take 500 mg by mouth 2 (two) times daily with a meal. ) 30 tablet 0  . metoprolol succinate (TOPROL-XL) 100 MG 24 hr tablet Take 100 mg by mouth daily with supper. Take with or immediately following a meal.     . omeprazole (PRILOSEC) 20 MG capsule Take 20 mg by mouth daily with supper.     . simvastatin (ZOCOR) 20 MG tablet Take 20 mg by mouth daily with supper.     . tamsulosin (FLOMAX) 0.4 MG CAPS Take 0.4 mg by mouth daily after breakfast.    . traMADol (ULTRAM) 50 MG tablet Take 1 tablet (50 mg total) by mouth every 6 (six) hours as needed. 20 tablet 0   No current facility-administered medications for this visit.     REVIEW OF SYSTEMS:   [X]  denotes positive finding, [ ]  denotes negative finding Cardiac  Comments:  Chest pain or chest  pressure:    Shortness of breath upon exertion:    Short of breath when lying flat:    Irregular heart rhythm:        Vascular    Pain in calf, thigh, or hip brought on by ambulation: x   Pain in feet at night that wakes you up from your sleep:     Blood clot in your veins:    Leg swelling:         Pulmonary    Oxygen at home:    Productive cough:     Wheezing:         Neurologic    Sudden weakness in arms or legs:     Sudden numbness in arms or legs:     Sudden onset of difficulty speaking or slurred speech:    Temporary loss of vision in one eye:     Problems with dizziness:         Gastrointestinal    Blood in stool:     Vomited blood:         Genitourinary    Burning when urinating:     Blood in urine:        Psychiatric    Major depression:         Hematologic    Bleeding problems:    Problems with blood clotting too easily:        Skin    Rashes or ulcers:        Constitutional    Fever or chills:      PHYSICAL EXAM:   Vitals:   11/30/17 0857  BP: 105/68  Pulse: 99  Resp: 20  Temp: 98.5 F (36.9 C)  TempSrc: Oral  SpO2: 99%  Weight: 184 lb 9.6 oz (83.7 kg)  Height: 5\' 10"  (1.778 m)    GENERAL: The patient is a well-nourished male, in no acute distress. The vital signs are documented above. CARDIAC: There is a regular rate and rhythm.  VASCULAR: Palpable dorsalis pedis pulse bilaterally.  Palpable posterior tibial pulse on the left.  No carotid bruits. PULMONARY: Non-labored respirations ABDOMEN: Soft and non-tender with normal pitched bowel sounds.  MUSCULOSKELETAL: There are no major deformities or cyanosis. NEUROLOGIC: No focal weakness or paresthesias are detected. SKIN: There are no ulcers or rashes noted. PSYCHIATRIC: The patient has a normal affect.  STUDIES:  I have reviewed his ultrasound today.  This shows the aneurysm measures 4.6 x 4.3.  MEDICAL ISSUES:   Status post endovascular aneurysm repair of the abdominal aorta.  The  patient continues to do very well.  Ultrasound shows that the aneurysm is without complications.  I will continue to monitor him on an annual basis with ultrasound.  Patient is complaining of pain in his legs.  This is very inconsistent and affects both legs equally.  He does have palpable pedal pulses, and therefore I reassured him that I do not think this is secondary to vascular insufficiency.    Annamarie Major, MD [facility] [phone] Pager [phone]

## 2017-12-14 DIAGNOSIS — I714 Abdominal aortic aneurysm, without rupture: Secondary | ICD-10-CM | POA: Diagnosis not present

## 2017-12-14 DIAGNOSIS — E118 Type 2 diabetes mellitus with unspecified complications: Secondary | ICD-10-CM | POA: Diagnosis not present

## 2017-12-14 DIAGNOSIS — E785 Hyperlipidemia, unspecified: Secondary | ICD-10-CM | POA: Diagnosis not present

## 2017-12-14 DIAGNOSIS — Z125 Encounter for screening for malignant neoplasm of prostate: Secondary | ICD-10-CM | POA: Diagnosis not present

## 2017-12-14 DIAGNOSIS — E039 Hypothyroidism, unspecified: Secondary | ICD-10-CM | POA: Diagnosis not present

## 2017-12-14 DIAGNOSIS — I10 Essential (primary) hypertension: Secondary | ICD-10-CM | POA: Diagnosis not present

## 2017-12-14 DIAGNOSIS — E1165 Type 2 diabetes mellitus with hyperglycemia: Secondary | ICD-10-CM | POA: Diagnosis not present

## 2018-01-12 DIAGNOSIS — E039 Hypothyroidism, unspecified: Secondary | ICD-10-CM | POA: Diagnosis not present

## 2018-01-20 DIAGNOSIS — R69 Illness, unspecified: Secondary | ICD-10-CM | POA: Diagnosis not present

## 2018-01-26 DIAGNOSIS — R69 Illness, unspecified: Secondary | ICD-10-CM | POA: Diagnosis not present

## 2018-01-28 DIAGNOSIS — R69 Illness, unspecified: Secondary | ICD-10-CM | POA: Diagnosis not present

## 2018-02-01 DIAGNOSIS — L570 Actinic keratosis: Secondary | ICD-10-CM | POA: Diagnosis not present

## 2018-02-01 DIAGNOSIS — D2371 Other benign neoplasm of skin of right lower limb, including hip: Secondary | ICD-10-CM | POA: Diagnosis not present

## 2018-02-01 DIAGNOSIS — L821 Other seborrheic keratosis: Secondary | ICD-10-CM | POA: Diagnosis not present

## 2018-02-01 DIAGNOSIS — X32XXXA Exposure to sunlight, initial encounter: Secondary | ICD-10-CM | POA: Diagnosis not present

## 2018-02-01 DIAGNOSIS — Z85828 Personal history of other malignant neoplasm of skin: Secondary | ICD-10-CM | POA: Diagnosis not present

## 2018-02-01 DIAGNOSIS — L853 Xerosis cutis: Secondary | ICD-10-CM | POA: Diagnosis not present

## 2018-02-01 DIAGNOSIS — Z08 Encounter for follow-up examination after completed treatment for malignant neoplasm: Secondary | ICD-10-CM | POA: Diagnosis not present

## 2018-03-05 ENCOUNTER — Encounter: Payer: Self-pay | Admitting: Cardiovascular Disease

## 2018-03-05 ENCOUNTER — Ambulatory Visit: Payer: Medicare HMO | Admitting: Cardiovascular Disease

## 2018-03-05 VITALS — BP 126/77 | HR 65 | Ht 70.0 in | Wt 191.8 lb

## 2018-03-05 DIAGNOSIS — I10 Essential (primary) hypertension: Secondary | ICD-10-CM

## 2018-03-05 DIAGNOSIS — E7800 Pure hypercholesterolemia, unspecified: Secondary | ICD-10-CM | POA: Diagnosis not present

## 2018-03-05 NOTE — Assessment & Plan Note (Signed)
History of essential hypertension blood pressure measured 126/77. He is on diltiazem and metoprolol. Continue current meds at current dosing.

## 2018-03-05 NOTE — Assessment & Plan Note (Signed)
History of hyperlipidemia on statin therapy followed by his PCP 

## 2018-03-05 NOTE — Progress Notes (Signed)
03/05/2018 Dillon Wagner   02/20/1941  [id number]  Primary Physician Coy Saunas, MD Primary Cardiologist: Lorretta Harp MD FACP, [place], [place], [place]  HPI:  Dillon Wagner is a 77 y.o.  , mildly overweight, married Caucasian male, father of 58, grandfather to 2 grandchildren who I last in the office  03/04/17  He is referred to me through the courtesy of Dr. Thurnell Garbe for evaluation of abdominal aortic aneurysm found on CT scanning performed because of abdominal pain June 20, 2011.   The patient's risk factors are positive for hypertension, hyperlipidemia and type 2 diabetes. He quit smoking back in 1987 and smoked 30 pack-years prior to that. He has no family history of heart disease. He never had a heart attack or stroke and denied any chest pain or shortness of breath. He did retired from [employer] where he worked up until 18 years ago. He had a Myoview stress test performed in our office May 04, 2012, for preoperative clearance before elective left shoulder replacement which was negative. He had this done successfully at [facility] in November. We have been following his abdominal aortic ultrasound which most recently done in March of last year was 4.9 x 4.6 cm, not large enough to warrant revascularization. Abdominal ultrasound performed 01/17/16 revealed a maximum dimension of 4.9 cm. Dr. Alean Rinne at [facility] primary care follows his cholesterol. Since I saw him back over a year ago he has undergone endoluminal stent grafting for a 5.9 cm abdominal aortic admission by Dr. Trula Slade  04/17/16. Follow-up CT angiogram performed the following month showed this to be well-placed and widely patent. Otherwise, he remains asymptomatic.     Current Meds  Medication Sig  . acetaminophen (TYLENOL) 500 MG tablet Take 1,000 mg by mouth every 8 (eight) hours as needed for mild pain.  . Artificial Tear Solution (GENTEAL TEARS OP) Apply 1 drop to eye 2 (two) times daily as needed (dry  eyes).  Marland Kitchen aspirin 81 MG tablet Take 81 mg by mouth daily.  . diclofenac (VOLTAREN) 75 MG EC tablet Take 75 mg by mouth daily.  Marland Kitchen diltiazem (DILACOR XR) 240 MG 24 hr capsule Take 240 mg by mouth daily.  Marland Kitchen gabapentin (NEURONTIN) 600 MG tablet Take 600 mg by mouth 2 (two) times daily.  Marland Kitchen glipiZIDE (GLUCOTROL) 5 MG tablet Take 7.5 mg by mouth 2 (two) times daily before a meal. Verified takes 1.5 tablets 2 times daily  . levothyroxine (SYNTHROID, LEVOTHROID) 50 MCG tablet TAKE 1 TABLET BY MOUTH EVERY DAY AT 6AM  . loratadine (CLARITIN) 10 MG tablet Take 10 mg by mouth daily.  . metFORMIN (GLUCOPHAGE) 500 MG tablet Take by mouth.  . metoprolol succinate (TOPROL-XL) 100 MG 24 hr tablet Take 100 mg by mouth daily with supper. Take with or immediately following a meal.   . omeprazole (PRILOSEC) 40 MG capsule Take 40 mg by mouth.  . simvastatin (ZOCOR) 20 MG tablet Take 20 mg by mouth daily with supper.   . tamsulosin (FLOMAX) 0.4 MG CAPS Take 0.4 mg by mouth daily after breakfast.  . [DISCONTINUED] levothyroxine (SYNTHROID, LEVOTHROID) 75 MCG tablet Take 50 mcg by mouth daily before breakfast.   . [DISCONTINUED] metFORMIN (GLUCOPHAGE) 1000 MG tablet Take 1 tablet (1,000 mg total) by mouth 2 (two) times daily with a meal. (Patient taking differently: Take 500 mg by mouth 2 (two) times daily with a meal. )  . [DISCONTINUED] omeprazole (PRILOSEC) 20 MG capsule Take 20  mg by mouth daily with supper.   . [DISCONTINUED] traMADol (ULTRAM) 50 MG tablet Take 1 tablet (50 mg total) by mouth every 6 (six) hours as needed.     Allergies  Allergen Reactions  . Morphine And Related Other (See Comments)    Altered mental status (did not recognize spouse)  . Buprenorphine Hcl     Other reaction(s): Other (See Comments) Altered mental status (did not recognize spouse)  . Codeine Nausea And Vomiting    Social History   Socioeconomic History  . Marital status: Married    Spouse name: Not on file  . Number of  children: Not on file  . Years of education: Not on file  . Highest education level: Not on file  Social Needs  . Financial resource strain: Not on file  . Food insecurity - worry: Not on file  . Food insecurity - inability: Not on file  . Transportation needs - medical: Not on file  . Transportation needs - non-medical: Not on file  Occupational History  . Not on file  Tobacco Use  . Smoking status: Former Smoker    Packs/day: 2.00    Years: 28.00    Pack years: 56.00    Types: Cigarettes    Last attempt to quit: 04/28/1986    Years since quitting: 31.8  . Smokeless tobacco: Former Systems developer    Quit date: 03/22/1987  Substance and Sexual Activity  . Alcohol use: No    Alcohol/week: 0.0 oz  . Drug use: No  . Sexual activity: Not on file  Other Topics Concern  . Not on file  Social History Narrative  . Not on file     Review of Systems: General: negative for chills, fever, night sweats or weight changes.  Cardiovascular: negative for chest pain, dyspnea on exertion, edema, orthopnea, palpitations, paroxysmal nocturnal dyspnea or shortness of breath Dermatological: negative for rash Respiratory: negative for cough or wheezing Urologic: negative for hematuria Abdominal: negative for nausea, vomiting, diarrhea, bright red blood per rectum, melena, or hematemesis Neurologic: negative for visual changes, syncope, or dizziness All other systems reviewed and are otherwise negative except as noted above.    Blood pressure 126/77, pulse 65, height 5\' 10"  (1.778 m), weight 191 lb 12.8 oz (87 kg).  General appearance: alert and no distress Neck: no adenopathy, no carotid bruit, no JVD, supple, symmetrical, trachea midline and thyroid not enlarged, symmetric, no tenderness/mass/nodules Lungs: clear to auscultation bilaterally Heart: regular rate and rhythm, S1, S2 normal, no murmur, click, rub or gallop Extremities: extremities normal, atraumatic, no cyanosis or edema Pulses: 2+ and  symmetric Skin: Skin color, texture, turgor normal. No rashes or lesions Neurologic: Alert and oriented X 3, normal strength and tone. Normal symmetric reflexes. Normal coordination and gait  EKG sinus rhythm at 62 with left axis deviation and nonspecific ST and T-wave changes. I personally reviewed this EKG  ASSESSMENT AND PLAN:   HLD (hyperlipidemia) History of hyperlipidemia on statin therapy followed by his PCP  Essential hypertension History of essential hypertension blood pressure measured 126/77. He is on diltiazem and metoprolol. Continue current meds at current dosing.  AAA (abdominal aortic aneurysm) History of endoluminal stent grafting by Dr. Trula Slade which he follows      Lorretta Harp MD [facility], [facility] 03/05/2018 11:09 AM

## 2018-03-05 NOTE — Patient Instructions (Signed)

## 2018-03-05 NOTE — Assessment & Plan Note (Signed)
History of endoluminal stent grafting by Dr. Trula Slade which he follows

## 2018-03-15 DIAGNOSIS — K210 Gastro-esophageal reflux disease with esophagitis: Secondary | ICD-10-CM | POA: Diagnosis not present

## 2018-03-15 DIAGNOSIS — Z7984 Long term (current) use of oral hypoglycemic drugs: Secondary | ICD-10-CM | POA: Diagnosis not present

## 2018-03-15 DIAGNOSIS — E118 Type 2 diabetes mellitus with unspecified complications: Secondary | ICD-10-CM | POA: Diagnosis not present

## 2018-03-15 DIAGNOSIS — E785 Hyperlipidemia, unspecified: Secondary | ICD-10-CM | POA: Diagnosis not present

## 2018-03-15 DIAGNOSIS — R1032 Left lower quadrant pain: Secondary | ICD-10-CM | POA: Diagnosis not present

## 2018-03-15 DIAGNOSIS — Z0000 Encounter for general adult medical examination without abnormal findings: Secondary | ICD-10-CM | POA: Diagnosis not present

## 2018-03-15 DIAGNOSIS — E039 Hypothyroidism, unspecified: Secondary | ICD-10-CM | POA: Diagnosis not present

## 2018-03-15 DIAGNOSIS — I10 Essential (primary) hypertension: Secondary | ICD-10-CM | POA: Diagnosis not present

## 2018-08-05 DIAGNOSIS — H35369 Drusen (degenerative) of macula, unspecified eye: Secondary | ICD-10-CM | POA: Diagnosis not present

## 2018-08-05 DIAGNOSIS — H532 Diplopia: Secondary | ICD-10-CM | POA: Diagnosis not present

## 2018-08-05 DIAGNOSIS — Z961 Presence of intraocular lens: Secondary | ICD-10-CM | POA: Diagnosis not present

## 2018-08-05 DIAGNOSIS — H43812 Vitreous degeneration, left eye: Secondary | ICD-10-CM | POA: Diagnosis not present

## 2018-08-05 DIAGNOSIS — H35373 Puckering of macula, bilateral: Secondary | ICD-10-CM | POA: Diagnosis not present

## 2018-08-05 DIAGNOSIS — E119 Type 2 diabetes mellitus without complications: Secondary | ICD-10-CM | POA: Diagnosis not present

## 2018-08-06 DIAGNOSIS — R69 Illness, unspecified: Secondary | ICD-10-CM | POA: Diagnosis not present

## 2018-08-17 DIAGNOSIS — R69 Illness, unspecified: Secondary | ICD-10-CM | POA: Diagnosis not present

## 2018-08-24 DIAGNOSIS — H04123 Dry eye syndrome of bilateral lacrimal glands: Secondary | ICD-10-CM | POA: Diagnosis not present

## 2018-08-24 DIAGNOSIS — Z961 Presence of intraocular lens: Secondary | ICD-10-CM | POA: Diagnosis not present

## 2018-08-24 DIAGNOSIS — R69 Illness, unspecified: Secondary | ICD-10-CM | POA: Diagnosis not present

## 2018-09-25 DIAGNOSIS — R69 Illness, unspecified: Secondary | ICD-10-CM | POA: Diagnosis not present

## 2018-09-27 DIAGNOSIS — D1801 Hemangioma of skin and subcutaneous tissue: Secondary | ICD-10-CM | POA: Diagnosis not present

## 2018-09-27 DIAGNOSIS — D2371 Other benign neoplasm of skin of right lower limb, including hip: Secondary | ICD-10-CM | POA: Diagnosis not present

## 2018-09-27 DIAGNOSIS — X32XXXA Exposure to sunlight, initial encounter: Secondary | ICD-10-CM | POA: Diagnosis not present

## 2018-09-27 DIAGNOSIS — L570 Actinic keratosis: Secondary | ICD-10-CM | POA: Diagnosis not present

## 2018-09-27 DIAGNOSIS — D485 Neoplasm of uncertain behavior of skin: Secondary | ICD-10-CM | POA: Diagnosis not present

## 2018-09-27 DIAGNOSIS — C44319 Basal cell carcinoma of skin of other parts of face: Secondary | ICD-10-CM | POA: Diagnosis not present

## 2018-09-27 DIAGNOSIS — L821 Other seborrheic keratosis: Secondary | ICD-10-CM | POA: Diagnosis not present

## 2018-09-27 DIAGNOSIS — D3615 Benign neoplasm of peripheral nerves and autonomic nervous system of abdomen: Secondary | ICD-10-CM | POA: Diagnosis not present

## 2018-09-27 DIAGNOSIS — C44612 Basal cell carcinoma of skin of right upper limb, including shoulder: Secondary | ICD-10-CM | POA: Diagnosis not present

## 2018-10-05 DIAGNOSIS — I10 Essential (primary) hypertension: Secondary | ICD-10-CM | POA: Diagnosis not present

## 2018-10-05 DIAGNOSIS — M1990 Unspecified osteoarthritis, unspecified site: Secondary | ICD-10-CM | POA: Diagnosis not present

## 2018-10-05 DIAGNOSIS — D649 Anemia, unspecified: Secondary | ICD-10-CM | POA: Diagnosis not present

## 2018-10-05 DIAGNOSIS — M6281 Muscle weakness (generalized): Secondary | ICD-10-CM | POA: Diagnosis not present

## 2018-10-05 DIAGNOSIS — E039 Hypothyroidism, unspecified: Secondary | ICD-10-CM | POA: Diagnosis not present

## 2018-10-05 DIAGNOSIS — E785 Hyperlipidemia, unspecified: Secondary | ICD-10-CM | POA: Diagnosis not present

## 2018-10-05 DIAGNOSIS — E118 Type 2 diabetes mellitus with unspecified complications: Secondary | ICD-10-CM | POA: Diagnosis not present

## 2018-10-05 DIAGNOSIS — K210 Gastro-esophageal reflux disease with esophagitis: Secondary | ICD-10-CM | POA: Diagnosis not present

## 2018-10-05 DIAGNOSIS — H9193 Unspecified hearing loss, bilateral: Secondary | ICD-10-CM | POA: Diagnosis not present

## 2018-10-18 DIAGNOSIS — R69 Illness, unspecified: Secondary | ICD-10-CM | POA: Diagnosis not present

## 2018-10-25 DIAGNOSIS — C44612 Basal cell carcinoma of skin of right upper limb, including shoulder: Secondary | ICD-10-CM | POA: Diagnosis not present

## 2018-10-25 DIAGNOSIS — L570 Actinic keratosis: Secondary | ICD-10-CM | POA: Diagnosis not present

## 2018-10-25 DIAGNOSIS — X32XXXA Exposure to sunlight, initial encounter: Secondary | ICD-10-CM | POA: Diagnosis not present

## 2018-11-04 DIAGNOSIS — C44319 Basal cell carcinoma of skin of other parts of face: Secondary | ICD-10-CM | POA: Diagnosis not present

## 2018-11-04 DIAGNOSIS — C44612 Basal cell carcinoma of skin of right upper limb, including shoulder: Secondary | ICD-10-CM | POA: Diagnosis not present

## 2018-11-15 DIAGNOSIS — R69 Illness, unspecified: Secondary | ICD-10-CM | POA: Diagnosis not present

## 2018-12-07 DIAGNOSIS — H903 Sensorineural hearing loss, bilateral: Secondary | ICD-10-CM | POA: Diagnosis not present

## 2018-12-07 DIAGNOSIS — H6123 Impacted cerumen, bilateral: Secondary | ICD-10-CM | POA: Diagnosis not present

## 2018-12-07 DIAGNOSIS — H9011 Conductive hearing loss, unilateral, right ear, with unrestricted hearing on the contralateral side: Secondary | ICD-10-CM | POA: Diagnosis not present

## 2018-12-09 DIAGNOSIS — R69 Illness, unspecified: Secondary | ICD-10-CM | POA: Diagnosis not present

## 2019-01-18 ENCOUNTER — Other Ambulatory Visit: Payer: Self-pay

## 2019-01-18 DIAGNOSIS — I714 Abdominal aortic aneurysm, without rupture, unspecified: Secondary | ICD-10-CM

## 2019-01-18 NOTE — Progress Notes (Signed)
History of Present Illness:  Patient is a 78 y.o. year old male who presents for evaluation of abdominal aortic aneurysm. He is status post endovascular repair of a 5.9 cm infrarenal abdominal aortic aneurysm on 04/17/2016. The aneurysm is currently 3.45 cm in diameter by [place].  The patient denies abdominal pain.  The patient denies back pain.  He reports no changes in his health since his last visit.  Other medical problems include DM, HTN, neck and lumbar DDD, and hyperlipidemia.    Past Medical History:  Diagnosis Date  . AAA (abdominal aortic aneurysm) (HCC)    4.9 x 4.6 cm  . Arthritis   . Cancer ([place])    skin ca  removed  . Diabetes ([place])    type 2  . Double vision    right eye  . History of stress test 05/04/2012   negative stress test  . HOH (hard of hearing)    both ears  . Hyperlipidemia   . Hypertension   . Hypothyroidism   . Left inguinal hernia   . S/P wrist surgery 09/11/1958   right wrist remove    Past Surgical History:  Procedure Laterality Date  . ABDOMINAL AORTIC ENDOVASCULAR STENT GRAFT N/A 04/17/2016   Procedure: ABDOMINAL AORTIC ENDOVASCULAR STENT GRAFT;  Surgeon: Serafina Mitchell, MD;  Location: [place];  Service: Vascular;  Laterality: N/A;  . [place]  . BACK SURGERY  2004   lower  . ELBOW SURGERY Left 1957   Pins  . EYE SURGERY Bilateral    ioc for cataracts  . 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 INGUINAL HERNIA REPAIR Left 10/27/2017   Procedure: OPEN REPAIR OF LEFT INGUINAL HERNA WITH MESH;  Surgeon: Greer Pickerel, MD;  [facility];  Service: General;  Laterality: Left;  . INSERTION OF MESH Left 10/27/2017   Procedure: INSERTION OF MESH;  Surgeon: Greer Pickerel, MD;  [facility];  Service: General;  Laterality: Left;  . NECK SURGERY  05/2003   Dr Consuello Masse  . NM MYOCAR PERF WALL MOTION  05/04/2012   protocol:Lexiscan, lung to heart ratio 38%, post EF 67%, small fixed apical defect  . SHOULDER SURGERY Left  10/31/2012   shoulder replaced   last surgery total of 4 surgeries  . stomach sugery  1969   Infected fat on left side  . thumb surgery Right 01/1995     Social History Social History   Tobacco Use  . Smoking status: Former Smoker    Packs/day: 2.00    Years: 28.00    Pack years: 56.00    Types: Cigarettes    Last attempt to quit: 04/28/1986    Years since quitting: 32.7  . Smokeless tobacco: Former Systems developer    Quit date: 03/22/1987  Substance Use Topics  . Alcohol use: No    Alcohol/week: 0.0 standard drinks  . Drug use: No    Family History Family History  Problem Relation Age of Onset  . Cancer Mother 74       ovarian  . Cancer Father 60       cancer in the stomach    Allergies  Allergies  Allergen Reactions  . Morphine And Related Other (See Comments)    Altered mental status (did not recognize spouse)  . Buprenorphine Hcl     Other reaction(s): Other (See Comments) Altered mental status (did not recognize spouse)  . Codeine Nausea And Vomiting  Current Outpatient Medications  Medication Sig Dispense Refill  . acetaminophen (TYLENOL) 500 MG tablet Take 1,000 mg by mouth every 8 (eight) hours as needed for mild pain.    . Artificial Tear Solution (GENTEAL TEARS OP) Apply 1 drop to eye 2 (two) times daily as needed (dry eyes).    Marland Kitchen aspirin 81 MG tablet Take 81 mg by mouth daily.    Marland Kitchen diltiazem (DILACOR XR) 240 MG 24 hr capsule Take 240 mg by mouth daily.    Marland Kitchen gabapentin (NEURONTIN) 600 MG tablet Take 600 mg by mouth 2 (two) times daily.    Marland Kitchen glipiZIDE (GLUCOTROL) 5 MG tablet Take 7.5 mg by mouth 2 (two) times daily before a meal. Verified takes 1.5 tablets 2 times daily    . levothyroxine (SYNTHROID, LEVOTHROID) 50 MCG tablet TAKE 1 TABLET BY MOUTH EVERY DAY AT 6AM    . loratadine (CLARITIN) 10 MG tablet Take 10 mg by mouth daily.    . meloxicam (MOBIC) 7.5 MG tablet 1 PO DAILY PRN JOINT PAIN    . metFORMIN (GLUCOPHAGE) 500 MG tablet Take by mouth.    .  metoprolol succinate (TOPROL-XL) 100 MG 24 hr tablet Take 100 mg by mouth daily with supper. Take with or immediately following a meal.     . omeprazole (PRILOSEC) 40 MG capsule Take 40 mg by mouth.    . simvastatin (ZOCOR) 20 MG tablet Take 20 mg by mouth daily with supper.     . tamsulosin (FLOMAX) 0.4 MG CAPS Take 0.4 mg by mouth daily after breakfast.     No current facility-administered medications for this visit.     ROS:   General:  No weight loss, Fever, chills  HEENT: No recent headaches, no nasal bleeding, no visual changes, no sore throat  Neurologic: No dizziness, blackouts, seizures. No recent symptoms of stroke or mini- stroke. No recent episodes of slurred speech, or temporary blindness.  Cardiac: No recent episodes of chest pain/pressure, no shortness of breath at rest.  No shortness of breath with exertion.  Denies history of atrial fibrillation or irregular heartbeat  Vascular: No history of rest pain in feet.  No history of claudication.  No history of non-healing ulcer, No history of DVT   Pulmonary: No home oxygen, no productive cough, no hemoptysis,  No asthma or wheezing  Musculoskeletal:  [ ]  Arthritis, [ ]  Low back pain,  [ ]  Joint pain  Hematologic:No history of hypercoagulable state.  No history of easy bleeding.  No history of anemia  Gastrointestinal: No hematochezia or melena,  No gastroesophageal reflux, no trouble swallowing  Urinary: [ ]  chronic Kidney disease, [ ]  on HD - [ ]  MWF or [ ]  TTHS, [ ]  Burning with urination, [ ]  Frequent urination, [ ]  Difficulty urinating;   Skin: No rashes  Psychological: No history of anxiety,  No history of depression   Physical Examination  Vitals:   01/20/19 0934  BP: 133/64  Pulse: 63  Resp: 20  Temp: 97.6 F (36.4 C)  SpO2: 99%  Weight: 186 lb (84.4 kg)  Height: 5\' 10"  (1.778 m)    Body mass index is 26.69 kg/m.  General:  Alert and oriented, no acute distress HEENT: Normal,  normmocephalic Neck: No bruit or JVD Pulmonary: Clear to auscultation bilaterally Cardiac: Regular Rate and Rhythm without murmur Gastrointestinal: Soft, non-tender, non-distended, no mass, no scars Skin: No rash Extremity Pulses:  2+ radial, brachial, femoral, dorsalis pedis, posterior tibial pulses bilaterally Musculoskeletal: No  edema, well healed right hand amputation Neurologic: Upper and lower extremity motor 5/5 and symmetric  DATA:  01/20/2019 AAA ultrasound Endovascular Aortic Repair (EVAR): +----------+----------------+-------------------+-------------------+           Diameter AP (cm)Diameter Trans (cm)Velocities (cm/sec) +----------+----------------+-------------------+-------------------+ Aorta     3.45            3.43               79                  +----------+----------------+-------------------+-------------------+ Right Limb1.48            1.60               51                  +----------+----------------+-------------------+-------------------+ Left Limb 1.43            1.37               50                  +----------+----------------+-------------------+-------------------+      Summary: Abdominal Aorta: Patent endovascular aneurysm repair with no evidence of endoleak. The largest aortic diameter has decreased compared to prior exam. Previous diameter measurement was 4.6 cm obtained on 11/30/2017.   ASSESSMENT:  AAA s/p EVAR by Dr. Trula Slade 04/17/2016   PLAN: He remains very active without complaints of abdominal or lumbar pain.  He has quit smoking back in 1987 and maintains good control of his HTN, DM and cholesterol.  His aneurysm has continued to reduce in size with a starting size of 5.9 cm and is now 3.45 cm.    At this time I will schedule a follow up for 2 years with an ultrasound.  We reviewed symptoms of sudden sever abdominal pain and or lumbar pain that he will call if he has concerns.     Roxy Horseman PA-C [facility] Office: [phone]  MD in clinic:[place]

## 2019-01-20 ENCOUNTER — Ambulatory Visit: Payer: Medicare HMO | Admitting: Physician Assistant

## 2019-01-20 ENCOUNTER — Ambulatory Visit (HOSPITAL_COMMUNITY)
Admission: RE | Admit: 2019-01-20 | Discharge: 2019-01-20 | Disposition: A | Discharge status: Home / Self Care | Payer: Medicare HMO | Source: Ambulatory Visit | Attending: Family | Admitting: Family

## 2019-01-20 ENCOUNTER — Other Ambulatory Visit: Payer: Self-pay

## 2019-01-20 VITALS — BP 133/64 | HR 63 | Temp 97.6°F | Resp 20 | Ht 70.0 in | Wt 186.0 lb

## 2019-01-20 DIAGNOSIS — I714 Abdominal aortic aneurysm, without rupture, unspecified: Secondary | ICD-10-CM

## 2019-03-02 DIAGNOSIS — R69 Illness, unspecified: Secondary | ICD-10-CM | POA: Diagnosis not present

## 2019-03-07 DIAGNOSIS — Z125 Encounter for screening for malignant neoplasm of prostate: Secondary | ICD-10-CM | POA: Diagnosis not present

## 2019-03-07 DIAGNOSIS — I714 Abdominal aortic aneurysm, without rupture: Secondary | ICD-10-CM | POA: Diagnosis not present

## 2019-03-07 DIAGNOSIS — K210 Gastro-esophageal reflux disease with esophagitis: Secondary | ICD-10-CM | POA: Diagnosis not present

## 2019-03-07 DIAGNOSIS — Z7901 Long term (current) use of anticoagulants: Secondary | ICD-10-CM | POA: Diagnosis not present

## 2019-03-07 DIAGNOSIS — M1990 Unspecified osteoarthritis, unspecified site: Secondary | ICD-10-CM | POA: Diagnosis not present

## 2019-03-07 DIAGNOSIS — I951 Orthostatic hypotension: Secondary | ICD-10-CM | POA: Diagnosis not present

## 2019-03-07 DIAGNOSIS — R42 Dizziness and giddiness: Secondary | ICD-10-CM | POA: Diagnosis not present

## 2019-03-07 DIAGNOSIS — I10 Essential (primary) hypertension: Secondary | ICD-10-CM | POA: Diagnosis not present

## 2019-03-07 DIAGNOSIS — E039 Hypothyroidism, unspecified: Secondary | ICD-10-CM | POA: Diagnosis not present

## 2019-03-07 DIAGNOSIS — E118 Type 2 diabetes mellitus with unspecified complications: Secondary | ICD-10-CM | POA: Diagnosis not present

## 2019-03-07 DIAGNOSIS — D649 Anemia, unspecified: Secondary | ICD-10-CM | POA: Diagnosis not present

## 2019-03-28 DIAGNOSIS — E039 Hypothyroidism, unspecified: Secondary | ICD-10-CM | POA: Diagnosis not present

## 2019-03-28 DIAGNOSIS — R42 Dizziness and giddiness: Secondary | ICD-10-CM | POA: Diagnosis not present

## 2019-03-28 DIAGNOSIS — E7849 Other hyperlipidemia: Secondary | ICD-10-CM | POA: Diagnosis not present

## 2019-03-28 DIAGNOSIS — E118 Type 2 diabetes mellitus with unspecified complications: Secondary | ICD-10-CM | POA: Diagnosis not present

## 2019-08-26 ENCOUNTER — Ambulatory Visit: Payer: Medicare HMO | Admitting: Cardiology

## 2019-08-26 ENCOUNTER — Encounter: Payer: Self-pay | Admitting: Cardiology

## 2019-08-26 ENCOUNTER — Other Ambulatory Visit: Payer: Self-pay

## 2019-08-26 VITALS — BP 138/60 | HR 77 | Temp 97.6°F | Ht 70.0 in | Wt 181.4 lb

## 2019-08-26 DIAGNOSIS — R079 Chest pain, unspecified: Secondary | ICD-10-CM

## 2019-08-26 DIAGNOSIS — R0600 Dyspnea, unspecified: Secondary | ICD-10-CM

## 2019-08-26 DIAGNOSIS — Z8679 Personal history of other diseases of the circulatory system: Secondary | ICD-10-CM

## 2019-08-26 DIAGNOSIS — Z9889 Other specified postprocedural states: Secondary | ICD-10-CM | POA: Diagnosis not present

## 2019-08-26 DIAGNOSIS — E785 Hyperlipidemia, unspecified: Secondary | ICD-10-CM | POA: Diagnosis not present

## 2019-08-26 DIAGNOSIS — E119 Type 2 diabetes mellitus without complications: Secondary | ICD-10-CM | POA: Diagnosis not present

## 2019-08-26 DIAGNOSIS — R0609 Other forms of dyspnea: Secondary | ICD-10-CM

## 2019-08-26 DIAGNOSIS — R0789 Other chest pain: Secondary | ICD-10-CM

## 2019-08-26 DIAGNOSIS — I10 Essential (primary) hypertension: Secondary | ICD-10-CM

## 2019-08-26 LAB — TROPONIN I: Troponin I: <0.01 ng/mL (ref 0.00–0.04)

## 2019-08-26 LAB — BASIC METABOLIC PANEL
BUN/Creatinine Ratio: 13 (ref 10–24)
BUN: 15 mg/dL (ref 8–27)
CO2: 21 mmol/L (ref 20–29)
Calcium: 9.7 mg/dL (ref 8.6–10.2)
Chloride: 102 mmol/L (ref 96–106)
Creatinine, Ser: 1.14 mg/dL (ref 0.76–1.27)
GFR calc Af Amer: 70 mL/min/{1.73_m2} (ref >59)
GFR calc non Af Amer: 60 mL/min/{1.73_m2} (ref >59)
Glucose: 187 mg/dL — ABNORMAL HIGH (ref 65–99)
Potassium: 4.9 mmol/L (ref 3.5–5.2)
Sodium: 138 mmol/L (ref 134–144)

## 2019-08-26 LAB — CBC WITH DIFFERENTIAL/PLATELET
Basophils Absolute: 0 10*3/uL (ref 0.0–0.2)
Basos: 0 %
EOS (ABSOLUTE): 0.4 10*3/uL (ref 0.0–0.4)
Eos: 5 %
Hematocrit: 42.3 % (ref 37.5–51.0)
Hemoglobin: 12.9 g/dL — ABNORMAL LOW (ref 13.0–17.7)
Immature Grans (Abs): 0 10*3/uL (ref 0.0–0.1)
Immature Granulocytes: 0 %
Lymphocytes Absolute: 1.7 10*3/uL (ref 0.7–3.1)
Lymphs: 24 %
MCH: 25.7 pg — ABNORMAL LOW (ref 26.6–33.0)
MCHC: 30.5 g/dL — ABNORMAL LOW (ref 31.5–35.7)
MCV: 84 fL (ref 79–97)
Monocytes Absolute: 0.8 10*3/uL (ref 0.1–0.9)
Monocytes: 11 %
Neutrophils Absolute: 4.3 10*3/uL (ref 1.4–7.0)
Neutrophils: 60 %
Platelets: 288 10*3/uL (ref 150–450)
RBC: 5.01 x10E6/uL (ref 4.14–5.80)
RDW: 15.8 % — ABNORMAL HIGH (ref 11.6–15.4)
WBC: 7.3 10*3/uL (ref 3.4–10.8)

## 2019-08-26 MED ORDER — NITROGLYCERIN 0.4 MG SL SUBL: 0.4000 mg | SUBLINGUAL_TABLET | SUBLINGUAL | 3 refills | Status: DC | PRN

## 2019-08-26 MED ORDER — ISOSORBIDE MONONITRATE ER 30 MG PO TB24: 30.0000 mg | ORAL_TABLET | Freq: Every day | ORAL | 3 refills | Status: DC

## 2019-08-26 NOTE — Assessment & Plan Note (Signed)
On oral agents followed by PCP. 

## 2019-08-26 NOTE — Assessment & Plan Note (Signed)
Endovascular stent graft 2017

## 2019-08-26 NOTE — Assessment & Plan Note (Signed)
The patient says this has come on gradually over the past month or so

## 2019-08-26 NOTE — Assessment & Plan Note (Signed)
Controlled.  

## 2019-08-26 NOTE — Assessment & Plan Note (Signed)
On Zocor 20 mg, followed by PCP

## 2019-08-26 NOTE — Progress Notes (Signed)
Cardiology Office Note:    Date:  08/26/2019   ID:  Dillon Wagner, DOB 11/04/1941, MRN [id number]  PCP:  Coy Saunas, MD  Cardiologist:  Quay Burow, MD  Electrophysiologist:  None   Referring MD: Coy Saunas, MD   DOE, SSCP  History of Present Illness:    Dillon Wagner is a 78 y.o. male with a hx of vascular disease, status post abdominal aortic aneurysm stent graft repair in 2017.  Other medical issues include hypertension, non-insulin-dependent diabetes, and dyslipidemia.  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  He lives in [place] with his wife.  They have some land and he usually does his own yard work.  Dr. Gwenlyn Found last saw him in March 2019.  The patient had a remote low risk nuclear scan in 2013, he has no documented coronary disease.  Mr. Fulco made this appointment today because he is noted increasing dyspnea on exertion and has had some intermittent chest pain.  Some of his chest pain is localized to his right chest but he admits sometimes he gets midsternal pressure.  It is not always exertional and at times he thinks it is GERD.  He has had some symptoms that wake him up at night, he also says that he is able to mow his lawn without problems.  He denies any associated nausea vomiting or diaphoresis.  His symptoms have been going on for over a month.  He saw his primary care doctor who wanted him to go to the hospital in [place] but the patient wanted to come here since Dr. Gwenlyn Found and Dr. Trula Slade follow him.  Past Medical History:  Diagnosis Date  . AAA (abdominal aortic aneurysm) (HCC)    4.9 x 4.6 cm  . Arthritis   . Cancer ([place])    skin ca  removed  . Diabetes ([place])    type 2  . Double vision    right eye  . History of stress test 05/04/2012   negative stress test  . HOH (hard of hearing)    both ears  . Hyperlipidemia   . Hypertension   . Hypothyroidism   . Left inguinal hernia   . S/P wrist surgery 09/11/1958   right wrist remove    Past Surgical History:  Procedure Laterality Date  . ABDOMINAL AORTIC ENDOVASCULAR STENT GRAFT N/A 04/17/2016   Procedure: ABDOMINAL AORTIC ENDOVASCULAR STENT GRAFT;  Surgeon: Serafina Mitchell, MD;  Location: [facility];  Service: Vascular;  Laterality: N/A;  . [place]  . BACK SURGERY  2004   lower  . ELBOW SURGERY Left 1957   Pins  . EYE SURGERY Bilateral    ioc for cataracts  . 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 INGUINAL HERNIA REPAIR Left 10/27/2017   Procedure: OPEN REPAIR OF LEFT INGUINAL HERNA WITH MESH;  Surgeon: Greer Pickerel, MD;  [facility];  Service: General;  Laterality: Left;  . INSERTION OF MESH Left 10/27/2017   Procedure: INSERTION OF MESH;  Surgeon: Greer Pickerel, MD;  [facility];  Service: General;  Laterality: Left;  . NECK SURGERY  05/2003   Dr Consuello Masse  . NM MYOCAR PERF WALL MOTION  05/04/2012   protocol:Lexiscan, lung to heart ratio 38%, post EF 67%, small fixed apical defect  . SHOULDER SURGERY Left 10/31/2012   shoulder replaced   last surgery total of 4 surgeries  . stomach  sugery  1969   Infected fat on left side  . thumb surgery Right 01/1995    Current Medications: Current Meds  Medication Sig  . acetaminophen (TYLENOL) 500 MG tablet Take 1,000 mg by mouth every 8 (eight) hours as needed for mild pain.  . Artificial Tear Solution (GENTEAL TEARS OP) Apply 1 drop to eye 2 (two) times daily as needed (dry eyes).  Marland Kitchen aspirin 81 MG tablet Take 81 mg by mouth daily.  Marland Kitchen diltiazem (DILACOR XR) 240 MG 24 hr capsule Take 240 mg by mouth daily.  Marland Kitchen gabapentin (NEURONTIN) 600 MG tablet Take 600 mg by mouth 2 (two) times daily.  Marland Kitchen glipiZIDE (GLUCOTROL) 5 MG tablet Take 10 mg by mouth 2 (two) times daily before a meal. Verified takes 1.5 tablets 2 times daily  . levothyroxine (SYNTHROID, LEVOTHROID) 50 MCG tablet TAKE 1 TABLET BY MOUTH EVERY DAY AT 6AM  . loratadine (CLARITIN) 10 MG tablet Take 10 mg by mouth  daily.  . metFORMIN (GLUCOPHAGE) 500 MG tablet Take by mouth.  . metoprolol succinate (TOPROL-XL) 100 MG 24 hr tablet Take 100 mg by mouth daily with supper. Take with or immediately following a meal.   . omeprazole (PRILOSEC) 40 MG capsule Take 40 mg by mouth.  . simvastatin (ZOCOR) 20 MG tablet Take 20 mg by mouth daily with supper.   . tamsulosin (FLOMAX) 0.4 MG CAPS Take 0.4 mg by mouth daily after breakfast.     Allergies:   Morphine and related, Buprenorphine hcl, and Codeine   Social History   Socioeconomic History  . Marital status: Married    Spouse name: Not on file  . Number of children: Not on file  . Years of education: Not on file  . Highest education level: Not on file  Occupational History  . Not on file  Social Needs  . Financial resource strain: Not on file  . Food insecurity    Worry: Not on file    Inability: Not on file  . Transportation needs    Medical: Not on file    Non-medical: Not on file  Tobacco Use  . Smoking status: Former Smoker    Packs/day: 2.00    Years: 28.00    Pack years: 56.00    Types: Cigarettes    Quit date: 04/28/1986    Years since quitting: 33.3  . Smokeless tobacco: Former Systems developer    Quit date: 03/22/1987  Substance and Sexual Activity  . Alcohol use: No    Alcohol/week: 0.0 standard drinks  . Drug use: No  . Sexual activity: Not on file  Lifestyle  . Physical activity    Days per week: Not on file    Minutes per session: Not on file  . Stress: Not on file  Relationships  . Social Herbalist on phone: Not on file    Gets together: Not on file    Attends religious service: Not on file    Active member of club or organization: Not on file    Attends meetings of clubs or organizations: Not on file    Relationship status: Not on file  Other Topics Concern  . Not on file  Social History Narrative  . Not on file     Family History: The patient's family history includes Cancer (age of onset: 45) in his father;  Cancer (age of onset: 67) in his mother.  ROS:   Please see the history of present illness.  All other systems reviewed and are negative.  EKGs/Labs/Other Studies Reviewed:    The following studies were reviewed today: Myoview 2013  EKG:  EKG is ordered today.  The ekg ordered today demonstrates NSR with frequent PVCs  Recent Labs: No results found for requested labs within last 8760 hours.  Recent Lipid Panel No results found for: CHOL, TRIG, HDL, CHOLHDL, VLDL, LDLCALC, LDLDIRECT  Physical Exam:    VS:  BP 138/60   Pulse 77   Temp 97.6 F (36.4 C)   Ht 5\' 10"  (1.778 m)   Wt 181 lb 6.4 oz (82.3 kg)   SpO2 95%   BMI 26.03 kg/m     Wt Readings from Last 3 Encounters:  08/26/19 181 lb 6.4 oz (82.3 kg)  01/20/19 186 lb (84.4 kg)  03/05/18 191 lb 12.8 oz (87 kg)     GEN:  Well nourished, well developed in no acute distress HEENT: Normal NECK: No JVD; No carotid bruits LYMPHATICS: No lymphadenopathy CARDIAC: RRR, no murmurs, rubs, gallops RESPIRATORY:  Clear to auscultation without rales, wheezing or rhonchi  ABDOMEN: Soft, non-tender, non-distended MUSCULOSKELETAL:  No edema; 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 The patient appears comfortable and relaxed, joking with the nurses. 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  We will add Imdur 30 mg QD, NTG SL PRN, and arrange for OP Myoview  early next week.  I have also ordered an echo, CBC, Troponin, and BMP.  He knows to go to the closest hospital if he has severe chest pain or SOB.  If his Troponin comes back elevated he knows he will need to be admitted.    Medication Adjustments/Labs and Tests Ordered: Current medicines are reviewed at length with the patient today.  Concerns regarding medicines are outlined above.  Orders Placed This Encounter  Procedures  . Troponin I  . CBC with Differential  . Basic Metabolic Panel (BMET)  . MYOCARDIAL PERFUSION IMAGING  . EKG 12-Lead  . ECHOCARDIOGRAM COMPLETE   Meds ordered this encounter  Medications  . nitroGLYCERIN (NITROSTAT) 0.4 MG SL tablet    Sig: Place 1 tablet (0.4 mg total) under the tongue every 5 (five) minutes as needed for chest pain.    Dispense:  25 tablet    Refill:  3  . isosorbide mononitrate (IMDUR) 30 MG 24 hr tablet    Sig: Take 1 tablet (30 mg total) by mouth daily.    Dispense:  90 tablet    Refill:  3    Patient Instructions  Medication Instructions:  START Imdur 30mg  Take 1 tablet once a day START Nitrostat Take 1 tablet as needed for emergency chest pain, before taking second dose CALL 911. DO NOT TAKE MORE THAN 3 DOSES. If you need a refill on your cardiac medications before your next appointment, please call your pharmacy.  Lab work: Your physician recommends that you return for lab work in: [facility], BMET; TROPONIN IS STAT If you have labs (blood work) drawn today and your tests are completely normal, you will receive your results only by: Marland Kitchen MyChart Message (if you have MyChart) OR . A paper copy in the mail If you have any lab test that is abnormal or we need to change your treatment, we will call you to review the results.  Testing/Procedures: Your physician has requested that you have an echocardiogram. Echocardiography is a painless test that uses sound waves to create images of your heart. It provides your doctor with information  about the size and shape of your heart and how well your heart's chambers and valves are working. This procedure takes approximately one hour. There are no restrictions for this procedure. [place] has requested that you have a lexiscan myoview. For further information please visit HugeFiesta.[email or web address]. Please follow instruction sheet, as given. NO MEDICATIONS TO HOLD  Follow-Up: At [facility], you and your health needs are our priority.  As part of our continuing mission to provide you with exceptional heart care, we have created designated Provider Care Teams.  These Care Teams include your primary Cardiologist (physician) and Advanced Practice Providers (APPs -  Physician Assistants and Nurse Practitioners) who all work together to provide you with the care you need, when you need it. You will need a follow up appointment in 3 months.  Please call our office 2 months in advance to schedule this appointment.  You may see Quay Burow, MD or one of the following Advanced Practice Providers on your designated Care Team:   Kerin Ransom, PA-C Roby Lofts, [place] . Sande Rives, PA-C  Any Other Special Instructions Will Be Listed Below (If Applicable).      Signed, Kerin Ransom, PA-C  08/26/2019 9:22 AM    [facility]

## 2019-08-26 NOTE — Patient Instructions (Signed)
Medication Instructions:  START Imdur 30mg  Take 1 tablet once a day START Nitrostat Take 1 tablet as needed for emergency chest pain, before taking second dose CALL 911. DO NOT TAKE MORE THAN 3 DOSES. If you need a refill on your cardiac medications before your next appointment, please call your pharmacy.   Lab work: Your physician recommends that you return for lab work in: [facility], BMET; TROPONIN IS STAT If you have labs (blood work) drawn today and your tests are completely normal, you will receive your results only by: Marland Kitchen MyChart Message (if you have MyChart) OR . A paper copy in the mail If you have any lab test that is abnormal or we need to change your treatment, we will call you to review the results.  Testing/Procedures: Your physician has requested that you have an echocardiogram. Echocardiography is a painless test that uses sound waves to create images of your heart. It provides your doctor with information about the size and shape of your heart and how well your heart's chambers and valves are working. This procedure takes approximately one hour. There are no restrictions for this procedure. [place] has requested that you have a lexiscan myoview. For further information please visit HugeFiesta[facility][email or web address]. Please follow instruction sheet, as given. NO MEDICATIONS TO HOLD  Follow-Up: At [facility], you and your health needs are our priority.  As part of our continuing mission to provide you with exceptional heart care, we have created designated Provider Care Teams.  These Care Teams include your primary Cardiologist (physician) and Advanced Practice Providers (APPs -  Physician Assistants and Nurse Practitioners) who all work together to provide you with the care you need, when you need it. You will need a follow up appointment in 3 months.  Please call our office 2 months in advance to schedule this appointment.  You may see Quay Burow, MD  or one of the following Advanced Practice Providers on your designated Care Team:   Kerin Ransom, PA-C Roby Lofts, [place] . Sande Rives, PA-C  Any Other Special Instructions Will Be Listed Below (If Applicable).

## 2019-08-26 NOTE — Assessment & Plan Note (Signed)
Some typical, some atypical symptoms

## 2019-09-01 ENCOUNTER — Other Ambulatory Visit (HOSPITAL_COMMUNITY): Payer: Medicare HMO

## 2019-09-07 ENCOUNTER — Telehealth (HOSPITAL_COMMUNITY): Payer: Self-pay

## 2019-09-07 NOTE — Telephone Encounter (Signed)
Encounter complete. 

## 2019-09-09 ENCOUNTER — Ambulatory Visit (HOSPITAL_COMMUNITY)
Admission: RE | Admit: 2019-09-09 | Discharge: 2019-09-09 | Disposition: A | Discharge status: Home / Self Care | Payer: Medicare HMO | Source: Ambulatory Visit | Attending: Cardiology | Admitting: Cardiology

## 2019-09-09 ENCOUNTER — Ambulatory Visit (HOSPITAL_BASED_OUTPATIENT_CLINIC_OR_DEPARTMENT_OTHER): Payer: Medicare HMO

## 2019-09-09 ENCOUNTER — Other Ambulatory Visit: Payer: Self-pay

## 2019-09-09 DIAGNOSIS — Z8679 Personal history of other diseases of the circulatory system: Secondary | ICD-10-CM | POA: Insufficient documentation

## 2019-09-09 DIAGNOSIS — Z9889 Other specified postprocedural states: Secondary | ICD-10-CM

## 2019-09-09 DIAGNOSIS — E119 Type 2 diabetes mellitus without complications: Secondary | ICD-10-CM | POA: Insufficient documentation

## 2019-09-09 DIAGNOSIS — I10 Essential (primary) hypertension: Secondary | ICD-10-CM | POA: Insufficient documentation

## 2019-09-09 DIAGNOSIS — R079 Chest pain, unspecified: Secondary | ICD-10-CM

## 2019-09-09 DIAGNOSIS — E785 Hyperlipidemia, unspecified: Secondary | ICD-10-CM

## 2019-09-09 DIAGNOSIS — R0609 Other forms of dyspnea: Secondary | ICD-10-CM | POA: Diagnosis present

## 2019-09-09 LAB — MYOCARDIAL PERFUSION IMAGING
LV dias vol: 99 mL (ref 62–150)
LV sys vol: 54 mL
Peak HR: 78 {beats}/min
Rest HR: 67 {beats}/min
SDS: 3
SRS: 8
SSS: 11
TID: 1.63

## 2019-09-09 LAB — ECHOCARDIOGRAM COMPLETE
Height: 70 in
Weight: 2896 oz

## 2019-09-09 MED ORDER — TECHNETIUM TC 99M TETROFOSMIN IV KIT
30.9000 | PACK | Freq: Once | INTRAVENOUS | Status: AC | PRN
  Administered 2019-09-09: 30.9 via INTRAVENOUS
  Filled 2019-09-09: qty 31

## 2019-09-09 MED ORDER — REGADENOSON 0.4 MG/5ML IV SOLN
0.4000 mg | Freq: Once | INTRAVENOUS | Status: AC
  Administered 2019-09-09: 0.4 mg via INTRAVENOUS

## 2019-09-09 MED ORDER — TECHNETIUM TC 99M TETROFOSMIN IV KIT
11.0000 | PACK | Freq: Once | INTRAVENOUS | Status: AC | PRN
  Administered 2019-09-09: 11 via INTRAVENOUS
  Filled 2019-09-09: qty 11

## 2019-09-14 ENCOUNTER — Telehealth: Payer: Self-pay

## 2019-09-14 NOTE — Telephone Encounter (Addendum)
Left a message for the patient to give our office a call back for his Echo and stress test results.  ----- Message from Erlene Quan, PA-C sent at 09/13/2019 12:29 PM EDT ----- Please let the patient know I reviewed his echo and stress test- both look OK- if he is doing well just continue current rx and keep follow up with Dr Gwenlyn Found in Dec.  If he is having continued or increased symptoms call back.  Kerin Ransom PA-C 09/13/2019 12:29 PM

## 2019-09-15 ENCOUNTER — Telehealth: Payer: Self-pay | Admitting: Cardiovascular Disease

## 2019-09-15 NOTE — Telephone Encounter (Signed)
Patient would like a call regarding the results below after 2 PM today. Thanks!   -- Message from Erlene Quan, [place] sent at 09/13/2019 12:29 PM EDT ----- Please let the patient know I reviewed his echo and stress test- both look OK- if he is doing well just continue current rx and keep follow up with Dr Gwenlyn Found in Dec.  If he is having continued or increased symptoms call back.  Kerin Ransom PA-C 09/13/2019 12:29 PM

## 2019-09-15 NOTE — Telephone Encounter (Signed)
New message:     Patient returning call back concering his results. Patient would like to call back after 2 pm.

## 2019-09-15 NOTE — Telephone Encounter (Signed)
Pt aware of results and recommendations ./cy

## 2019-10-11 ENCOUNTER — Other Ambulatory Visit: Payer: Self-pay | Admitting: Cardiology

## 2019-11-29 ENCOUNTER — Ambulatory Visit: Payer: Medicare HMO | Admitting: Cardiovascular Disease

## 2019-11-29 ENCOUNTER — Other Ambulatory Visit: Payer: Self-pay

## 2019-11-29 ENCOUNTER — Encounter: Payer: Self-pay | Admitting: Cardiovascular Disease

## 2019-11-29 DIAGNOSIS — E785 Hyperlipidemia, unspecified: Secondary | ICD-10-CM | POA: Diagnosis not present

## 2019-11-29 DIAGNOSIS — R0789 Other chest pain: Secondary | ICD-10-CM | POA: Diagnosis not present

## 2019-11-29 DIAGNOSIS — Z9889 Other specified postprocedural states: Secondary | ICD-10-CM | POA: Diagnosis not present

## 2019-11-29 DIAGNOSIS — I10 Essential (primary) hypertension: Secondary | ICD-10-CM

## 2019-11-29 DIAGNOSIS — Z8679 Personal history of other diseases of the circulatory system: Secondary | ICD-10-CM

## 2019-11-29 NOTE — Assessment & Plan Note (Signed)
History of dyslipidemia on pravastatin (previously on simvastatin) followed by his PCP

## 2019-11-29 NOTE — Patient Instructions (Signed)
Medication Instructions:  Your physician recommends that you continue on your current medications as directed. Please refer to the Current Medication list given to you today.  If you need a refill on your cardiac medications before your next appointment, please call your pharmacy.   Lab work: NONE  Testing/Procedures: NONE  Follow-Up: At [facility], you and your health needs are our priority.  As part of our continuing mission to provide you with exceptional heart care, we have created designated Provider Care Teams.  These Care Teams include your primary Cardiologist (physician) and Advanced Practice Providers (APPs -  Physician Assistants and Nurse Practitioners) who all work together to provide you with the care you need, when you need it. You may see Quay Burow, MD or one of the following Advanced Practice Providers on your designated Care Team:    Kerin Ransom, PA-C  [place], [place]  Coletta Memos, [place]  Your physician wants you to follow-up in: 6 months with [place] physician wants you to follow-up in: 1 year with Dr Gwenlyn Found

## 2019-11-29 NOTE — Assessment & Plan Note (Signed)
History of endoluminal stent grafting for infrarenal abdominal evaluation by Dr. Trula Slade in 2017.

## 2019-11-29 NOTE — Assessment & Plan Note (Signed)
History of chest pressure and some dyspnea on exertion with recent 2D echo performed 08/18/2019 which was entirely normal as was a Myoview stress test. His symptoms somewhat improved with the addition of isosorbide. He is also on antireflux medications which contribute to that improvement as well.

## 2019-11-29 NOTE — Assessment & Plan Note (Signed)
History of essential hypertension with blood pressure measured today at one. He is on diltiazem, metoprolol.

## 2019-11-29 NOTE — Progress Notes (Signed)
11/29/2019 Dillon Wagner   1941/09/27  [id number]  Primary Physician Coy Saunas, MD Primary Cardiologist: Lorretta Harp MD Dillon Wagner, [place]  HPI:  Dillon Wagner is a 78 y.o.  mildly overweight, married Caucasian male, father of 86, grandfather to 2 grandchildren who I last in the office  3/8/2019he is referred to me through the courtesy of Dr. Thurnell Garbe for evaluation of abdominal aortic aneurysm found on CT scanning performed because of abdominal pain June 20, 2011.   The patient's risk factors are positive for hypertension, hyperlipidemia and type 2 diabetes. He quit smoking back in 1987 and smoked 30 pack-years prior to that. He has no family history of heart disease. He never had a heart attack or stroke and denied any chest pain or shortness of breath. He did retired from [employer] where he worked up until 18 years ago. He had a Myoview stress test performed in our office May 04, 2012, for preoperative clearance before elective left shoulder replacement which was negative. He had this done successfully at [facility] in November. We have been following his abdominal aortic ultrasound which most recently done in March of last year was 4.9 x 4.6 cm, not large enough to warrant revascularization. Abdominal ultrasound performed 01/17/16 revealed a maximum dimension of 4.9 cm. Dr. Alean Rinne at [facility] primary care follows his cholesterol. Since I saw him back over a year ago he has undergone endoluminal stent grafting for a 5.9 cm abdominal aortic admission by Dr. Brabham4/20/17. Follow-up CT angiogram performed the following month showed this to be well-placed and widely patent. Otherwise, he remains asymptomatic.  He saw Kerin Ransom, PA-C in the office 08/18/2019 complaining of some shortness of breath and chest pressure which was somewhat atypical. It was right-sided and also substernal. A 2D echocardiogram performed 08/18/2019 was normal as was a Myoview stress  test. He was empirically begun on low-dose isosorbide which resulted in some improvement in his chest pain. He is also on antireflux medications.    Current Meds  Medication Sig  . acetaminophen (TYLENOL) 500 MG tablet Take 1,000 mg by mouth every 8 (eight) hours as needed for mild pain.  . Artificial Tear Solution (GENTEAL TEARS OP) Apply 1 drop to eye 2 (two) times daily as needed (dry eyes).  Marland Kitchen aspirin 81 MG tablet Take 81 mg by mouth daily.  Marland Kitchen diltiazem (DILACOR XR) 240 MG 24 hr capsule Take 240 mg by mouth daily.  Marland Kitchen gabapentin (NEURONTIN) 600 MG tablet Take 600 mg by mouth 2 (two) times daily.  Marland Kitchen glipiZIDE (GLUCOTROL) 5 MG tablet Take 10 mg by mouth 2 (two) times daily before a meal. Verified takes 1.5 tablets 2 times daily  . isosorbide mononitrate (IMDUR) 30 MG 24 hr tablet Take 1 tablet (30 mg total) by mouth daily.  Marland Kitchen levothyroxine (SYNTHROID, LEVOTHROID) 50 MCG tablet TAKE 1 TABLET BY MOUTH EVERY DAY AT 6AM  . loratadine (CLARITIN) 10 MG tablet Take 10 mg by mouth daily.  . metFORMIN (GLUCOPHAGE) 500 MG tablet Take by mouth.  . metoprolol succinate (TOPROL-XL) 100 MG 24 hr tablet Take 100 mg by mouth daily with supper. Take with or immediately following a meal.   . nitroGLYCERIN (NITROSTAT) 0.4 MG SL tablet PLACE 1 TABLET (0.4 MG TOTAL) UNDER THE TONGUE EVERY 5 (FIVE) MINUTES AS NEEDED FOR CHEST PAIN.  Marland Kitchen omeprazole (PRILOSEC) 40 MG capsule Take 40 mg by mouth.  . simvastatin (ZOCOR) 20 MG tablet Take 20  mg by mouth daily with supper.   . tamsulosin (FLOMAX) 0.4 MG CAPS Take 0.4 mg by mouth daily after breakfast.     Allergies  Allergen Reactions  . Morphine And Related Other (See Comments)    Altered mental status (did not recognize spouse)  . Buprenorphine Hcl     Other reaction(s): Other (See Comments) Altered mental status (did not recognize spouse)  . Codeine Nausea And Vomiting    Social History   Socioeconomic History  . Marital status: Married    Spouse name:  Not on file  . Number of children: Not on file  . Years of education: Not on file  . Highest education level: Not on file  Occupational History  . Not on file  Social Needs  . Financial resource strain: Not on file  . Food insecurity    Worry: Not on file    Inability: Not on file  . Transportation needs    Medical: Not on file    Non-medical: Not on file  Tobacco Use  . Smoking status: Former Smoker    Packs/day: 2.00    Years: 28.00    Pack years: 56.00    Types: Cigarettes    Quit date: 04/28/1986    Years since quitting: 33.6  . Smokeless tobacco: Former Systems developer    Quit date: 03/22/1987  Substance and Sexual Activity  . Alcohol use: No    Alcohol/week: 0.0 standard drinks  . Drug use: No  . Sexual activity: Not on file  Lifestyle  . Physical activity    Days per week: Not on file    Minutes per session: Not on file  . Stress: Not on file  Relationships  . Social Herbalist on phone: Not on file    Gets together: Not on file    Attends religious service: Not on file    Active member of club or organization: Not on file    Attends meetings of clubs or organizations: Not on file    Relationship status: Not on file  . Intimate partner violence    Fear of current or ex partner: Not on file    Emotionally abused: Not on file    Physically abused: Not on file    Forced sexual activity: Not on file  Other Topics Concern  . Not on file  Social History Narrative  . Not on file     Review of Systems: General: negative for chills, fever, night sweats or weight changes.  Cardiovascular: negative for chest pain, dyspnea on exertion, edema, orthopnea, palpitations, paroxysmal nocturnal dyspnea or shortness of breath Dermatological: negative for rash Respiratory: negative for cough or wheezing Urologic: negative for hematuria Abdominal: negative for nausea, vomiting, diarrhea, bright red blood per rectum, melena, or hematemesis Neurologic: negative for visual  changes, syncope, or dizziness All other systems reviewed and are otherwise negative except as noted above.    Blood pressure 140/70, pulse (!) 57, temperature (!) 97.2 F (36.2 C), height 5\' 10"  (1.778 m), weight 186 lb (84.4 kg).  General appearance: alert and no distress Neck: no adenopathy, no carotid bruit, no JVD, supple, symmetrical, trachea midline and thyroid not enlarged, symmetric, no tenderness/mass/nodules Lungs: clear to auscultation bilaterally Heart: regular rate and rhythm, S1, S2 normal, no murmur, click, rub or gallop Extremities: extremities normal, atraumatic, no cyanosis or edema Pulses: 2+ and symmetric Skin: Skin color, texture, turgor normal. No rashes or lesions Neurologic: Alert and oriented X 3, normal strength and  tone. Normal symmetric reflexes. Normal coordination and gait  EKG not performed today  ASSESSMENT AND PLAN:   Dyslipidemia History of dyslipidemia on pravastatin (previously on simvastatin) followed by his PCP  Essential hypertension History of essential hypertension with blood pressure measured today at one. He is on diltiazem, metoprolol.  S/P AAA repair History of endoluminal stent grafting for infrarenal abdominal evaluation by Dr. Trula Slade in 2017.  Chest pressure History of chest pressure and some dyspnea on exertion with recent 2D echo performed 08/18/2019 which was entirely normal as was a Myoview stress test. His symptoms somewhat improved with the addition of isosorbide. He is also on antireflux medications which contribute to that improvement as well.      Lorretta Harp MD FACP,FACC,FAHA, [facility] 11/29/2019 11:03 AM

## 2020-05-24 ENCOUNTER — Ambulatory Visit: Payer: Medicare HMO | Admitting: Cardiology

## 2020-05-24 ENCOUNTER — Encounter: Payer: Self-pay | Admitting: Cardiology

## 2020-05-24 ENCOUNTER — Other Ambulatory Visit: Payer: Self-pay

## 2020-05-24 VITALS — BP 128/62 | HR 58 | Temp 96.9°F | Ht 70.0 in | Wt 183.0 lb

## 2020-05-24 DIAGNOSIS — Z9889 Other specified postprocedural states: Secondary | ICD-10-CM

## 2020-05-24 DIAGNOSIS — Z8679 Personal history of other diseases of the circulatory system: Secondary | ICD-10-CM

## 2020-05-24 DIAGNOSIS — E785 Hyperlipidemia, unspecified: Secondary | ICD-10-CM

## 2020-05-24 DIAGNOSIS — E119 Type 2 diabetes mellitus without complications: Secondary | ICD-10-CM

## 2020-05-24 DIAGNOSIS — R0609 Other forms of dyspnea: Secondary | ICD-10-CM

## 2020-05-24 DIAGNOSIS — I10 Essential (primary) hypertension: Secondary | ICD-10-CM

## 2020-05-24 DIAGNOSIS — R0600 Dyspnea, unspecified: Secondary | ICD-10-CM

## 2020-05-24 MED ORDER — NITROGLYCERIN 0.4 MG SL SUBL: 0.4000 mg | SUBLINGUAL_TABLET | SUBLINGUAL | 3 refills | Status: AC | PRN

## 2020-05-24 MED ORDER — ISOSORBIDE MONONITRATE ER 30 MG PO TB24: 30.0000 mg | ORAL_TABLET | Freq: Every day | ORAL | 3 refills | Status: DC

## 2020-05-24 NOTE — Progress Notes (Signed)
Cardiology Office Note:    Date:  05/24/2020   ID:  Dillon Wagner, DOB 05-01-1941, MRN [id number]  PCP:  Dillon Saunas, MD  Cardiologist:  Dillon Burow, MD  Electrophysiologist:  None   Referring MD: Dillon Saunas, MD   Chief Complaint  Patient presents with  . Follow-up    6 months.    History of Present Illness:    Dillon Wagner is a 79 y.o. male with a hx of vascular disease, status post abdominal aortic aneurysm stent graft repair in 2017.  Other medical issues include hypertension, non-insulin-dependent diabetes, and dyslipidemia.  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  He lives in [place] with his wife.  They have some land and he usually does his own yard work.  I saw him in Aug 2020 and he complained of DOE and chest pain with some typical and atypical features.  Myoview and echo sept 2020 were normal.  He has since f/u with GI and is being treated with PPI for GERD.  In Aug we added Imdur and he does say that seemed to help his dyspnea.  He is active on his farm.  Yesterday he hoed his garden and earlier this week he was baling hay. He denies any exertional chest pain.  His main complaint is leg pain"ache" and Rt shoulder DJD.    Past Medical History:  Diagnosis Date  . AAA (abdominal aortic aneurysm) (HCC)    4.9 x 4.6 cm  . Arthritis   . Cancer ([place])    skin ca  removed  . Diabetes (Dillon Wagner)    type 2  . Double vision    right eye  . History of stress test 05/04/2012   negative stress test  . HOH (hard of hearing)    both ears  . Hyperlipidemia   . Hypertension   . Hypothyroidism   . Left inguinal hernia   . S/P wrist surgery 09/11/1958   right wrist remove    Past Surgical History:  Procedure Laterality Date  . ABDOMINAL AORTIC ENDOVASCULAR STENT GRAFT N/A 04/17/2016   Procedure: ABDOMINAL AORTIC ENDOVASCULAR STENT GRAFT;  Surgeon: Serafina Mitchell, MD;  Location: [facility];  Service: Vascular;  Laterality: N/A;  .  [place]  . BACK SURGERY  2004   lower  . ELBOW SURGERY Left 1957   Pins  . EYE SURGERY Bilateral    ioc for cataracts  . 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 INGUINAL HERNIA REPAIR Left 10/27/2017   Procedure: OPEN REPAIR OF LEFT INGUINAL HERNA WITH MESH;  Surgeon: Greer Pickerel, MD;  Location: [facility];  Service: General;  Laterality: Left;  . INSERTION OF MESH Left 10/27/2017   Procedure: INSERTION OF MESH;  Surgeon: Greer Pickerel, MD;  Location: [facility];  Service: General;  Laterality: Left;  . NECK SURGERY  05/2003   Dr Consuello Masse  . NM MYOCAR PERF WALL MOTION  05/04/2012   protocol:Lexiscan, lung to heart ratio 38%, post EF 67%, small fixed apical defect  . SHOULDER SURGERY Left 10/31/2012   shoulder replaced   last surgery total of 4 surgeries  . stomach sugery  1969   Infected fat on left side  . thumb surgery Right 01/1995    Current Medications: Current Meds  Medication Sig  . acetaminophen (TYLENOL) 500 MG tablet Take 1,000 mg by mouth every 8 (eight) hours as needed for  mild pain.  . Artificial Tear Solution (GENTEAL TEARS OP) Apply 1 drop to eye 2 (two) times daily as needed (dry eyes).  Marland Kitchen aspirin 81 MG tablet Take 81 mg by mouth daily.  Marland Kitchen diltiazem (DILACOR XR) 240 MG 24 hr capsule Take 240 mg by mouth daily.  Marland Kitchen gabapentin (NEURONTIN) 600 MG tablet Take 600 mg by mouth 2 (two) times daily.  Marland Kitchen glipiZIDE (GLUCOTROL) 5 MG tablet Take 10 mg by mouth 2 (two) times daily before a meal. Verified takes 1.5 tablets 2 times daily  . isosorbide mononitrate (IMDUR) 30 MG 24 hr tablet Take 1 tablet (30 mg total) by mouth daily.  Marland Kitchen levothyroxine (SYNTHROID, LEVOTHROID) 50 MCG tablet TAKE 1 TABLET BY MOUTH EVERY DAY AT 6AM  . loratadine (CLARITIN) 10 MG tablet Take 10 mg by mouth daily.  . metFORMIN (GLUCOPHAGE) 500 MG tablet Take by mouth. TAKE 1000 MG IN THE MORNING AND 500 MG AT NIGHT.  Marland Kitchen metoprolol succinate (TOPROL-XL) 100 MG 24 hr tablet Take 100 mg by  mouth daily with supper. Take with or immediately following a meal.   . nitroGLYCERIN (NITROSTAT) 0.4 MG SL tablet Place 1 tablet (0.4 mg total) under the tongue every 5 (five) minutes as needed for chest pain.  Marland Kitchen omeprazole (PRILOSEC) 40 MG capsule Take 40 mg by mouth.  . simvastatin (ZOCOR) 20 MG tablet Take 20 mg by mouth daily with supper.   . tamsulosin (FLOMAX) 0.4 MG CAPS Take 0.4 mg by mouth daily after breakfast.  . [DISCONTINUED] isosorbide mononitrate (IMDUR) 30 MG 24 hr tablet Take 1 tablet (30 mg total) by mouth daily.  . [DISCONTINUED] nitroGLYCERIN (NITROSTAT) 0.4 MG SL tablet PLACE 1 TABLET (0.4 MG TOTAL) UNDER THE TONGUE EVERY 5 (FIVE) MINUTES AS NEEDED FOR CHEST PAIN.     Allergies:   Morphine and related, Buprenorphine hcl, and Codeine   Social History   Socioeconomic History  . Marital status: Married    Spouse name: Not on file  . Number of children: Not on file  . Years of education: Not on file  . Highest education level: Not on file  Occupational History  . Not on file  Tobacco Use  . Smoking status: Former Smoker    Packs/day: 2.00    Years: 28.00    Pack years: 56.00    Types: Cigarettes    Quit date: 04/28/1986    Years since quitting: 34.0  . Smokeless tobacco: Former Systems developer    Quit date: 03/22/1987  Substance and Sexual Activity  . Alcohol use: No    Alcohol/week: 0.0 standard drinks  . Drug use: No  . Sexual activity: Not on file  Other Topics Concern  . Not on file  Social History Narrative  . Not on file   Social Determinants of Health   Financial Resource Strain:   . Difficulty of Paying Living Expenses:   Food Insecurity:   . Worried About Charity fundraiser in the Last Year:   . Arboriculturist in the Last Year:   Transportation Needs:   . Film/video editor (Medical):   Marland Kitchen Lack of Transportation (Non-Medical):   Physical Activity:   . Days of Exercise per Week:   . Minutes of Exercise per Session:   Stress:   . Feeling of  Stress :   Social Connections:   . Frequency of Communication with Friends and Family:   . Frequency of Social Gatherings with Friends and Family:   . Attends  Religious Services:   . Active Member of Clubs or Organizations:   . Attends Archivist Meetings:   Marland Kitchen Marital Status:      Family History: The patient's family history includes Cancer (age of onset: 28) in his father; Cancer (age of onset: 63) in his mother.  ROS:   Please see the history of present illness.     All other systems reviewed and are negative.  EKGs/Labs/Other Studies Reviewed:    The following studies were reviewed today: Myoview sept 2020 Echo Sept 2020  EKG:  EKG is ordered today.  The ekg ordered today demonstrates NSR, VR 58  Recent Labs: 08/26/2019: BUN 15; Creatinine, Ser 1.14; Hemoglobin 12.9; Platelets 288; Potassium 4.9; Sodium 138  Recent Lipid Panel No results found for: CHOL, TRIG, HDL, CHOLHDL, VLDL, LDLCALC, LDLDIRECT  Physical Exam:    VS:  BP 128/62 (BP Location: Left Arm, Patient Position: Sitting, Cuff Size: Normal)   Pulse (!) 58   Temp (!) 96.9 F (36.1 C)   Ht 5\' 10"  (1.778 m)   Wt 183 lb (83 kg)   BMI 26.26 kg/m     Wt Readings from Last 3 Encounters:  05/24/20 183 lb (83 kg)  11/29/19 186 lb (84.4 kg)  09/09/19 181 lb (82.1 kg)     GEN:  Well nourished, well developed in no acute distress HEENT: Normal NECK: No JVD; No carotid bruits LYMPHATICS: No lymphadenopathy CARDIAC: RRR, no murmurs, rubs, gallops RESPIRATORY:  Clear to auscultation without rales, wheezing or rhonchi  ABDOMEN: Soft, non-tender, non-distended MUSCULOSKELETAL:  No edema; Rt hand amputated. Rash on both LE. Distal pulses 3+/4.  SKIN: Warm and dry NEUROLOGIC:  Alert and oriented x 3 PSYCHIATRIC:  Normal affect   ASSESSMENT:    Chest pressure Myoview low risk Sept 2020  GERD- On PPI, GI following  DOE (dyspnea on exertion) Some improvement with Imdur  S/P AAA  repair Endovascular stent graft 2017  Non-insulin treated type 2 diabetes mellitus ([place]) On oral agents- followed by PCP  Dyslipidemia On Zocor 20 mg.  He has some leg pain- will try statin holiday.  Essential hypertension Controlled  PLAN:    Hold Zocor for 4 weeks- if no change resume.  If your leg pain resolves call us back for alternative Rx.  F/U 6 months.    Medication Adjustments/Labs and Tests Ordered: Current medicines are reviewed at length with the patient today.  Concerns regarding medicines are outlined above.  Orders Placed This Encounter  Procedures  . EKG 12-Lead   Meds ordered this encounter  Medications  . isosorbide mononitrate (IMDUR) 30 MG 24 hr tablet    Sig: Take 1 tablet (30 mg total) by mouth daily.    Dispense:  90 tablet    Refill:  3  . nitroGLYCERIN (NITROSTAT) 0.4 MG SL tablet    Sig: Place 1 tablet (0.4 mg total) under the tongue every 5 (five) minutes as needed for chest pain.    Dispense:  25 tablet    Refill:  3    Patient Instructions  Medication Instructions:   STOP Simvastatin for 4 weeks---if better after that, resume taking. If not better, please call our office.   *If you need a refill on your cardiac medications before your next appointment, please call your pharmacy*   Follow-Up: At [facility], you and your health needs are our priority.  As part of our continuing mission to provide you with exceptional heart care, we have created designated Provider  Care Teams.  These Care Teams include your primary Cardiologist (physician) and Advanced Practice Providers (APPs -  Physician Assistants and Nurse Practitioners) who all work together to provide you with the care you need, when you need it.  We recommend signing up for the patient portal called "MyChart".  Sign up information is provided on this After Visit Summary.  MyChart is used to connect with patients for Virtual Visits (Telemedicine).  Patients are able to view  lab/test results, encounter notes, upcoming appointments, etc.  Non-urgent messages can be sent to your provider as well.   To learn more about what you can do with MyChart, go to [email or web address].    Your next appointment:   6 month(s)  The format for your next appointment:   In Person  Provider:   You may see Dillon Burow, MD or one of the following Advanced Practice Providers on your designated Care Team:    Kerin Ransom, PA-C  [place], [place]  Coletta Memos, [place]    Other Instructions Please call our office 2 months in advance to schedule your follow-up appointment with Dr. Gwenlyn Found.     Angelena Form, PA-C  05/24/2020 10:07 AM    [place]

## 2020-05-24 NOTE — Patient Instructions (Signed)
Medication Instructions:   STOP Simvastatin for 4 weeks---if better after that, resume taking. If not better, please call our office.   *If you need a refill on your cardiac medications before your next appointment, please call your pharmacy*   Follow-Up: At [facility], you and your health needs are our priority.  As part of our continuing mission to provide you with exceptional heart care, we have created designated Provider Care Teams.  These Care Teams include your primary Cardiologist (physician) and Advanced Practice Providers (APPs -  Physician Assistants and Nurse Practitioners) who all work together to provide you with the care you need, when you need it.  We recommend signing up for the patient portal called "MyChart".  Sign up information is provided on this After Visit Summary.  MyChart is used to connect with patients for Virtual Visits (Telemedicine).  Patients are able to view lab/test results, encounter notes, upcoming appointments, etc.  Non-urgent messages can be sent to your provider as well.   To learn more about what you can do with MyChart, go to [email or web address].    Your next appointment:   6 month(s)  The format for your next appointment:   In Person  Provider:   You may see Quay Burow, MD or one of the following Advanced Practice Providers on your designated Care Team:    Kerin Ransom, PA-C  [place], [place]  Coletta Memos, [place]    Other Instructions Please call our office 2 months in advance to schedule your follow-up appointment with Dr. Gwenlyn Found.

## 2020-06-22 ENCOUNTER — Telehealth: Payer: Self-pay | Admitting: Cardiology

## 2020-06-22 DIAGNOSIS — E785 Hyperlipidemia, unspecified: Secondary | ICD-10-CM

## 2020-06-22 NOTE — Telephone Encounter (Signed)
Follow Up:    Pt said Dillon Wagner stopped  him from taking his Statin, because of his legs hurting and bothering him. He said he is no longer having problems with his legs since he stopped the Statin.. If Dillon Wagner wants him to take something else, please have him to call it in to his pharmacy. Pt says he can not take Lipitor.

## 2020-06-22 NOTE — Telephone Encounter (Signed)
Left message for pt to call to schedule follow up in the lipid clinic. Referral placed.

## 2020-06-24 NOTE — Telephone Encounter (Signed)
Needs repeat FLP for me to review before mking a recommendation

## 2020-06-25 NOTE — Telephone Encounter (Signed)
Left message for pt of dr berry's recommendation. Lab orders mailed to the pt

## 2020-06-25 NOTE — Addendum Note (Signed)
Addended by: Cristopher Estimable on: 06/25/2020 08:54 AM   Modules accepted: Orders

## 2020-06-27 ENCOUNTER — Encounter: Payer: Self-pay | Admitting: Cardiology

## 2020-06-27 NOTE — Progress Notes (Signed)
I saw Mr Razzano in May.  He complained of leg pain that I thought could be from statin Rx.  He stopped his Zocor and his leg pain resolved.  He has had previous problems with Lipitor.  Dr Gwenlyn Found wanted to see a recent lipid panel before making further recommendations.  The patient preferred to have this sent from his PCP instead of re drawing it and we are awaiting those results.  Kerin Ransom PA-C 06/27/2020 2:43 PM

## 2020-07-09 LAB — LIPID PANEL
Chol/HDL Ratio: 6.5 ratio — ABNORMAL HIGH (ref 0.0–5.0)
Cholesterol, Total: 222 mg/dL — ABNORMAL HIGH (ref 100–199)
HDL: 34 mg/dL — ABNORMAL LOW (ref >39)
LDL Chol Calc (NIH): 164 mg/dL — ABNORMAL HIGH (ref 0–99)
Triglycerides: 131 mg/dL (ref 0–149)
VLDL Cholesterol Cal: 24 mg/dL (ref 5–40)

## 2020-07-17 ENCOUNTER — Other Ambulatory Visit: Payer: Self-pay

## 2020-07-17 DIAGNOSIS — E7800 Pure hypercholesterolemia, unspecified: Secondary | ICD-10-CM

## 2020-10-15 ENCOUNTER — Ambulatory Visit: Payer: Medicare HMO | Admitting: Internal Medicine

## 2020-10-15 ENCOUNTER — Other Ambulatory Visit: Payer: Self-pay

## 2020-10-15 ENCOUNTER — Encounter: Payer: Self-pay | Admitting: Internal Medicine

## 2020-10-15 VITALS — BP 117/69 | HR 67 | Ht 70.0 in | Wt 184.2 lb

## 2020-10-15 DIAGNOSIS — M791 Myalgia, unspecified site: Secondary | ICD-10-CM

## 2020-10-15 DIAGNOSIS — E785 Hyperlipidemia, unspecified: Secondary | ICD-10-CM | POA: Diagnosis not present

## 2020-10-15 DIAGNOSIS — Z8679 Personal history of other diseases of the circulatory system: Secondary | ICD-10-CM

## 2020-10-15 DIAGNOSIS — Z9889 Other specified postprocedural states: Secondary | ICD-10-CM | POA: Diagnosis not present

## 2020-10-15 DIAGNOSIS — T466X5A Adverse effect of antihyperlipidemic and antiarteriosclerotic drugs, initial encounter: Secondary | ICD-10-CM

## 2020-10-15 DIAGNOSIS — E7800 Pure hypercholesterolemia, unspecified: Secondary | ICD-10-CM

## 2020-10-15 MED ORDER — ROSUVASTATIN CALCIUM 20 MG PO TABS: 20.0000 mg | ORAL_TABLET | Freq: Every day | ORAL | 3 refills | Status: DC

## 2020-10-15 NOTE — Progress Notes (Signed)
LIPID CLINIC CONSULT NOTE  Chief Complaint:  Manage dyslipidemia  Primary Care Physician: Coy Saunas, MD  Primary Cardiologist:  Quay Burow, MD  HPI:  Dillon Wagner is a 79 y.o. male who is being seen today for the evaluation of dyslipidemia at the request of Dr. Gwenlyn Wagner. This is a 79 year old male kindly referred by Dr. Dellia Beckwith, PA-C for evaluation and management of dyslipidemia.  [name] likes to go by the name Dillon Wagner, which I believe is his middle name.  He has a history of abdominal aortic aneurysm without rupture status post repair.  He also has PAD with bilateral carotid artery stenosis.  He has a history of dyslipidemia and has been on a number of statins including atorvastatin and more recently simvastatin 20 mg.  This caused him some fatigue and muscle discomfort and he was advised to discontinue it.  After holding it he noted his symptoms have resolved but he has since been off of that medication he thinks for about 7 months.  His most recent lipid profile was elevated showing total cholesterol 222, triglycerides 131, HDL 34 and LDL 164.  He reports a varied diet but tries to avoid fried foods and saturated fats.  PMHx:  Past Medical History:  Diagnosis Date  . AAA (abdominal aortic aneurysm) (HCC)    4.9 x 4.6 cm  . Arthritis   . Cancer ([place])    skin ca  removed  . Diabetes ([place])    type 2  . Double vision    right eye  . History of stress test 05/04/2012   negative stress test  . HOH (hard of hearing)    both ears  . Hyperlipidemia   . Hypertension   . Hypothyroidism   . Left inguinal hernia   . S/P wrist surgery 09/11/1958   right wrist remove    Past Surgical History:  Procedure Laterality Date  . ABDOMINAL AORTIC ENDOVASCULAR STENT GRAFT N/A 04/17/2016   Procedure: ABDOMINAL AORTIC ENDOVASCULAR STENT GRAFT;  Surgeon: Dillon Mitchell, MD;  Location: [facility];  Service: Vascular;  Laterality: N/A;  . [place]  . BACK SURGERY  2004    lower  . ELBOW SURGERY Left 1957   Pins  . EYE SURGERY Bilateral    ioc for cataracts  . 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 INGUINAL HERNIA REPAIR Left 10/27/2017   Procedure: OPEN REPAIR OF LEFT INGUINAL HERNA WITH MESH;  Surgeon: Greer Pickerel, MD;  Location: [facility];  Service: General;  Laterality: Left;  . INSERTION OF MESH Left 10/27/2017   Procedure: INSERTION OF MESH;  Surgeon: Greer Pickerel, MD;  Location: [facility];  Service: General;  Laterality: Left;  . NECK SURGERY  05/2003   Dr Consuello Masse  . NM MYOCAR PERF WALL MOTION  05/04/2012   protocol:Lexiscan, lung to heart ratio 38%, post EF 67%, small fixed apical defect  . SHOULDER SURGERY Left 10/31/2012   shoulder replaced   last surgery total of 4 surgeries  . stomach sugery  1969   Infected fat on left side  . thumb surgery Right 01/1995    FAMHx:  Family History  Problem Relation Age of Onset  . Cancer Mother 53       ovarian  . Cancer Father 77       cancer in the stomach    SOCHx:   reports that he quit smoking about 34 years ago. His smoking use  included cigarettes. He has a 56.00 pack-year smoking history. He quit smokeless tobacco use about 33 years ago. He reports that he does not drink alcohol and does not use drugs.  ALLERGIES:  Allergies  Allergen Reactions  . Morphine And Related Other (See Comments)    Altered mental status (did not recognize spouse)  . Buprenorphine Hcl     Other reaction(s): Other (See Comments) Altered mental status (did not recognize spouse)  . Codeine Nausea And Vomiting    ROS: Pertinent items noted in HPI and remainder of comprehensive ROS otherwise negative.  HOME MEDS: Current Outpatient Medications on File Prior to Visit  Medication Sig Dispense Refill  . acetaminophen (TYLENOL) 500 MG tablet Take 1,000 mg by mouth every 8 (eight) hours as needed for mild pain.    Marland Kitchen amoxicillin (AMOXIL) 500 MG capsule TAKE 4 CAPSULES BY MOUTH 1 HOUR PRIOR TO APPOINTMENT     . Artificial Tear Solution (GENTEAL TEARS OP) Apply 1 drop to eye 2 (two) times daily as needed (dry eyes).    Marland Kitchen aspirin 81 MG tablet Take 81 mg by mouth daily.    . diclofenac Sodium (VOLTAREN) 1 % GEL Apply topically.    Marland Kitchen diltiazem (DILACOR XR) 240 MG 24 hr capsule Take 240 mg by mouth daily.    Marland Kitchen gabapentin (NEURONTIN) 600 MG tablet Take 600 mg by mouth 2 (two) times daily.    Marland Kitchen glipiZIDE (GLUCOTROL) 5 MG tablet Take 10 mg by mouth 2 (two) times daily before a meal. Verified takes 1.5 tablets 2 times daily    . isosorbide mononitrate (IMDUR) 30 MG 24 hr tablet Take 1 tablet (30 mg total) by mouth daily. 90 tablet 3  . levothyroxine (SYNTHROID, LEVOTHROID) 50 MCG tablet TAKE 1 TABLET BY MOUTH EVERY DAY AT 6AM    . loratadine (CLARITIN) 10 MG tablet Take 10 mg by mouth daily.    . metFORMIN (GLUCOPHAGE) 500 MG tablet Take 500 mg by mouth 2 (two) times daily with a meal. TAKE 500 MG IN THE MORNING AND 500 MG AT NIGHT.    Marland Kitchen metoprolol succinate (TOPROL-XL) 100 MG 24 hr tablet Take 100 mg by mouth daily with supper. Take with or immediately following a meal.     . naproxen (NAPROSYN) 500 MG tablet TAKE 1/2 TO 1 TABLET BY MOUTH 2 TIMES A DAY AS NEEDED FOR LOW BACK AND JOINT PAIN    . nitroGLYCERIN (NITROSTAT) 0.4 MG SL tablet Place 1 tablet (0.4 mg total) under the tongue every 5 (five) minutes as needed for chest pain. 25 tablet 3  . nystatin (MYCOSTATIN/NYSTOP) powder Apply to affected area 3 times daily    . pantoprazole (PROTONIX) 40 MG tablet TAKE 1 TABLET (40 MG TOTAL) BY MOUTH TWO (2) TIMES A DAY.    . tamsulosin (FLOMAX) 0.4 MG CAPS Take 0.4 mg by mouth daily after breakfast.     No current facility-administered medications on file prior to visit.    LABS/IMAGING: No results Wagner for this or any previous visit (from the past 48 hour(s)). No results Wagner.  LIPID PANEL:    Component Value Date/Time   CHOL 222 (H) 07/09/2020 0955   TRIG 131 07/09/2020 0955   HDL 34 (L)  07/09/2020 0955   CHOLHDL 6.5 (H) 07/09/2020 0955   LDLCALC 164 (H) 07/09/2020 0955    WEIGHTS: Wt Readings from Last 3 Encounters:  10/15/20 184 lb 3.2 oz (83.6 kg)  05/24/20 183 lb (83 kg)  11/29/19 186 lb (  84.4 kg)    VITALS: BP 117/69   Pulse 67   Ht 5\' 10"  (1.778 m)   Wt 184 lb 3.2 oz (83.6 kg)   SpO2 100%   BMI 26.43 kg/m   EXAM: Deferred  EKG: Deferred  ASSESSMENT: 1. Mixed dyslipidemia-goal LDL less than 70 2. Statin intolerance-myalgias 3. History of AAA status post repair 4. Carotid artery disease  PLAN: 1.   Dillon Wagner has a mixed dyslipidemia with a lipid profile that remains well above target.  He was not able to tolerate atorvastatin in the past or more recently simvastatin.  He may have tried pravastatin but was not clear on it.  I would like to trial 1 further statin I would recommend rosuvastatin 20 mg daily.  If this is tolerated we will repeat lipids in 3 months.  If he cannot tolerate it then he would have failed to high potency statins and I would recommend we consider a PCSK9 inhibitor for him.  He did seem agreeable to this plan.  Plan follow-up with me in 3 months with lipids.  Thanks for the kind referral.  Pixie Casino, MD, FACC, [place] Director of the [facility] Lipid [facility] &  Cardiovascular Risk Reduction Clinic Diplomate of the American Board of Clinical Lipidology Attending Cardiologist  Direct Dial: [phone]  Fax: [phone]  Website:  [email or web address] 10/15/2020, 1:28 PM

## 2020-10-15 NOTE — Patient Instructions (Signed)
Medication Instructions:  START rosuvastatin 20mg  daily  *If you need a refill on your cardiac medications before your next appointment, please call your pharmacy*   Lab Work: FASTING lipid panel in 3 months  [facility]: [place], [place], [place] [id number]  If you have labs (blood work) drawn today and your tests are completely normal, you will receive your results only by: Marland Kitchen MyChart Message (if you have MyChart) OR . A paper copy in the mail If you have any lab test that is abnormal or we need to change your treatment, we will call you to review the results.   Follow-Up: At [facility], you and your health needs are our priority.  As part of our continuing mission to provide you with exceptional heart care, we have created designated Provider Care Teams.  These Care Teams include your primary Cardiologist (physician) and Advanced Practice Providers (APPs -  Physician Assistants and Nurse Practitioners) who all work together to provide you with the care you need, when you need it.  We recommend signing up for the patient portal called "MyChart".  Sign up information is provided on this After Visit Summary.  MyChart is used to connect with patients for Virtual Visits (Telemedicine).  Patients are able to view lab/test results, encounter notes, upcoming appointments, etc.  Non-urgent messages can be sent to your provider as well.   To learn more about what you can do with MyChart, go to [email or web address].    Your next appointment:   3-4 month(s) - lipid clinic  The format for your next appointment:   In Person  Provider:   K. Mali Hilty, MD   Other Instructions

## 2021-01-09 ENCOUNTER — Ambulatory Visit: Payer: Medicare HMO | Admitting: Cardiovascular Disease

## 2021-01-09 ENCOUNTER — Other Ambulatory Visit: Payer: Self-pay

## 2021-01-09 ENCOUNTER — Encounter: Payer: Self-pay | Admitting: Cardiovascular Disease

## 2021-01-09 DIAGNOSIS — E785 Hyperlipidemia, unspecified: Secondary | ICD-10-CM | POA: Diagnosis not present

## 2021-01-09 DIAGNOSIS — I10 Essential (primary) hypertension: Secondary | ICD-10-CM | POA: Diagnosis not present

## 2021-01-09 DIAGNOSIS — Z9889 Other specified postprocedural states: Secondary | ICD-10-CM | POA: Diagnosis not present

## 2021-01-09 DIAGNOSIS — Z8679 Personal history of other diseases of the circulatory system: Secondary | ICD-10-CM

## 2021-01-09 DIAGNOSIS — R0789 Other chest pain: Secondary | ICD-10-CM | POA: Diagnosis not present

## 2021-01-09 NOTE — Progress Notes (Signed)
01/09/2021 Dillon Wagner   [birth year]  [id number]  Primary Physician [name], MD Primary Cardiologist: Lorretta Harp MD FACP, [place], [place], [place]  HPI:  Dillon Wagner is a 80 y.o.  mildly overweight, married Caucasian male, father of 30, grandfather to 2 grandchildren who I last in the office  11/29/2019. He was referred to me through the courtesy of Dr. Thurnell Garbe for evaluation of abdominal aortic aneurysm found on CT scanning performed because of abdominal pain June 20, 2011.   The patient's risk factors are positive for hypertension, hyperlipidemia and type 2 diabetes. He quit smoking back in 1987 and smoked 30 pack-years prior to that. He has no family history of heart disease. He never had a heart attack or stroke and denied any chest pain or shortness of breath. He did retired from [employer] where he worked up until 18 years ago. He had a Myoview stress test performed in our office May 04, 2012, for preoperative clearance before elective left shoulder replacement which was negative. He had this done successfully at [facility] in November. We have been following his abdominal aortic ultrasound which most recently done in March of last year was 4.9 x 4.6 cm, not large enough to warrant revascularization. Abdominal ultrasound performed 01/17/16 revealed a maximum dimension of 4.9 cm. Dr. Alean Rinne at [facility] primary care follows his cholesterol. Since I saw him back over a year ago he has undergone endoluminal stent grafting for a 5.9 cm abdominal aortic admission by Dr. Brabham4/20/17. Follow-up CT angiogram performed the following month showed this to be well-placed and widely patent. Otherwise, he remains asymptomatic.  He saw Kerin Ransom, PA-C in the office 08/18/2019 complaining of some shortness of breath and chest pressure which was somewhat atypical. It was right-sided and also substernal. A 2D echocardiogram performed 08/18/2019 was normal as was a Myoview  stress test. He was empirically begun on low-dose isosorbide which resulted in some improvement in his chest pain. He is also on antireflux medications.  Since I saw him a year ago he is done well. He was complaining some leg leg pain apparently was put on a statin holiday but there was no follow-up. He saw Dr. Debara Pickett in the advanced lipid clinic 10/15/2020 who noticed that he had significant fatigue and muscle soreness from multiple statins. He mentioned that he thought his symptoms had improved on a statin holiday. Dr. Debara Pickett said that should he fail rosuvastatin he would be agreeable to a PCSK9.    Current Meds  Medication Sig  . acetaminophen (TYLENOL) 500 MG tablet Take 1,000 mg by mouth every 8 (eight) hours as needed for mild pain.  Marland Kitchen amoxicillin (AMOXIL) 500 MG capsule TAKE 4 CAPSULES BY MOUTH 1 HOUR PRIOR TO APPOINTMENT  . Artificial Tear Solution (GENTEAL TEARS OP) Apply 1 drop to eye 2 (two) times daily as needed (dry eyes).  Marland Kitchen aspirin 81 MG tablet Take 81 mg by mouth daily.  . diclofenac Sodium (VOLTAREN) 1 % GEL Apply topically.  Marland Kitchen diltiazem (DILACOR XR) 240 MG 24 hr capsule Take 240 mg by mouth daily.  . empagliflozin (JARDIANCE) 25 MG TABS tablet Take by mouth.  . gabapentin (NEURONTIN) 600 MG tablet Take 600 mg by mouth 2 (two) times daily.  Marland Kitchen glipiZIDE (GLUCOTROL) 5 MG tablet Take 10 mg by mouth 2 (two) times daily before a meal. Verified takes 1.5 tablets 2 times daily  . isosorbide mononitrate (IMDUR) 30 MG 24 hr tablet Take  1 tablet (30 mg total) by mouth daily.  Marland Kitchen levothyroxine (SYNTHROID, LEVOTHROID) 50 MCG tablet TAKE 1 TABLET BY MOUTH EVERY DAY AT 6AM  . loratadine (CLARITIN) 10 MG tablet Take 10 mg by mouth daily.  . metFORMIN (GLUCOPHAGE) 1000 MG tablet Take 500 mg by mouth in the morning and at bedtime.  . metoprolol succinate (TOPROL-XL) 100 MG 24 hr tablet Take 100 mg by mouth daily with supper. Take with or immediately following a meal.  . naproxen (NAPROSYN) 500 MG  tablet TAKE 1/2 TO 1 TABLET BY MOUTH 2 TIMES A DAY AS NEEDED FOR LOW BACK AND JOINT PAIN  . nitroGLYCERIN (NITROSTAT) 0.4 MG SL tablet Place 1 tablet (0.4 mg total) under the tongue every 5 (five) minutes as needed for chest pain.  Marland Kitchen nystatin (MYCOSTATIN/NYSTOP) powder Apply to affected area 3 times daily  . pantoprazole (PROTONIX) 40 MG tablet TAKE 1 TABLET (40 MG TOTAL) BY MOUTH TWO (2) TIMES A DAY.  . rosuvastatin (CRESTOR) 20 MG tablet Take by mouth.  . tamsulosin (FLOMAX) 0.4 MG CAPS Take 0.4 mg by mouth daily after breakfast.     Allergies  Allergen Reactions  . Morphine And Related Other (See Comments)    Altered mental status (did not recognize spouse)  . Buprenorphine Hcl     Other reaction(s): Other (See Comments) Altered mental status (did not recognize spouse)  . Codeine Nausea And Vomiting    Social History   Socioeconomic History  . Marital status: Married    Spouse name: Not on file  . Number of children: Not on file  . Years of education: Not on file  . Highest education level: Not on file  Occupational History  . Not on file  Tobacco Use  . Smoking status: Former Smoker    Packs/day: 2.00    Years: 28.00    Pack years: 56.00    Types: Cigarettes    Quit date: 04/28/1986    Years since quitting: 34.7  . Smokeless tobacco: Former Systems developer    Quit date: 03/22/1987  Vaping Use  . Vaping Use: Never used  Substance and Sexual Activity  . Alcohol use: No    Alcohol/week: 0.0 standard drinks  . Drug use: No  . Sexual activity: Not on file  Other Topics Concern  . Not on file  Social History Narrative  . Not on file   Social Determinants of Health   Financial Resource Strain: Not on file  Food Insecurity: Not on file  Transportation Needs: Not on file  Physical Activity: Not on file  Stress: Not on file  Social Connections: Not on file  Intimate Partner Violence: Not on file     Review of Systems: General: negative for chills, fever, night sweats or  weight changes.  Cardiovascular: negative for chest pain, dyspnea on exertion, edema, orthopnea, palpitations, paroxysmal nocturnal dyspnea or shortness of breath Dermatological: negative for rash Respiratory: negative for cough or wheezing Urologic: negative for hematuria Abdominal: negative for nausea, vomiting, diarrhea, bright red blood per rectum, melena, or hematemesis Neurologic: negative for visual changes, syncope, or dizziness All other systems reviewed and are otherwise negative except as noted above.    Blood pressure 122/68, pulse (!) 57, height 5\' 10"  (1.778 m), weight 168 lb (76.2 kg).  General appearance: alert and no distress Neck: no adenopathy, no carotid bruit, no JVD, supple, symmetrical, trachea midline and thyroid not enlarged, symmetric, no tenderness/mass/nodules Lungs: clear to auscultation bilaterally Heart: regular rate and rhythm, S1, S2  normal, no murmur, click, rub or gallop Extremities: extremities normal, atraumatic, no cyanosis or edema Pulses: 2+ and symmetric Skin: Skin color, texture, turgor normal. No rashes or lesions Neurologic: Alert and oriented X 3, normal strength and tone. Normal symmetric reflexes. Normal coordination and gait  EKG sinus bradycardia 57 with nonspecific ST and T wave changes. I personally reviewed this EKG.  ASSESSMENT AND PLAN:   Dyslipidemia History of dyslipidemia intolerant to statin therapy with recent lipid profile performed 09/21/2020 revealing total cholesterol 242, LDL of 186 and HDL of 36. We will pursue PCSK9 therapy.  Essential hypertension History of essential hypertension a blood pressure measured today at 122/68. He is on diltiazem, and metoprolol.  S/P AAA repair History of endoluminal stent grafting by Dr. Trula Slade 04/17/2016 which he follows noninvasively.  Chest pressure History of chest pressure in the past improved on low-dose oral nitrates.      Lorretta Harp MD FACP,FACC,FAHA,  [facility] 01/09/2021 11:51 AM

## 2021-01-09 NOTE — Assessment & Plan Note (Signed)
History of chest pressure in the past improved on low-dose oral nitrates.

## 2021-01-09 NOTE — Assessment & Plan Note (Signed)
History of endoluminal stent grafting by Dr. Trula Slade 04/17/2016 which he follows noninvasively.

## 2021-01-09 NOTE — Assessment & Plan Note (Signed)
History of essential hypertension a blood pressure measured today at 122/68. He is on diltiazem, and metoprolol.

## 2021-01-09 NOTE — Patient Instructions (Addendum)
Medication Instructions:  Your physician recommends that you continue on your current medications as directed. Please refer to the Current Medication list given to you today.  *If you need a refill on your cardiac medications before your next appointment, please call your pharmacy*   Lab Work: Your physician recommends that you return for lab work in: 1-2 weeks for lipid/liver profile  If you have labs (blood work) drawn today and your tests are completely normal, you will receive your results only by: . MyChart Message (if you have MyChart) OR . A paper copy in the mail If you have any lab test that is abnormal or we need to change your treatment, we will call you to review the results.   Follow-Up: At [facility], you and your health needs are our priority.  As part of our continuing mission to provide you with exceptional heart care, we have created designated Provider Care Teams.  These Care Teams include your primary Cardiologist (physician) and Advanced Practice Providers (APPs -  Physician Assistants and Nurse Practitioners) who all work together to provide you with the care you need, when you need it.  We recommend signing up for the patient portal called "MyChart".  Sign up information is provided on this After Visit Summary.  MyChart is used to connect with patients for Virtual Visits (Telemedicine).  Patients are able to view lab/test results, encounter notes, upcoming appointments, etc.  Non-urgent messages can be sent to your provider as well.   To learn more about what you can do with MyChart, go to [email or web address].    Your next appointment:   12 month(s)  The format for your next appointment:   In Person  Provider:   Jonathan Berry, MD 

## 2021-01-09 NOTE — Assessment & Plan Note (Signed)
History of dyslipidemia intolerant to statin therapy with recent lipid profile performed 09/21/2020 revealing total cholesterol 242, LDL of 186 and HDL of 36. We will pursue PCSK9 therapy.

## 2021-01-10 ENCOUNTER — Other Ambulatory Visit: Payer: Self-pay | Admitting: *Deleted

## 2021-01-10 DIAGNOSIS — I714 Abdominal aortic aneurysm, without rupture, unspecified: Secondary | ICD-10-CM

## 2021-01-23 ENCOUNTER — Ambulatory Visit: Payer: Medicare HMO | Admitting: Internal Medicine

## 2021-01-25 ENCOUNTER — Other Ambulatory Visit: Payer: Self-pay

## 2021-01-25 ENCOUNTER — Ambulatory Visit: Payer: Medicare HMO | Admitting: Physician Assistant

## 2021-01-25 ENCOUNTER — Ambulatory Visit (HOSPITAL_COMMUNITY)
Admission: RE | Admit: 2021-01-25 | Discharge: 2021-01-25 | Disposition: A | Discharge status: Home / Self Care | Payer: Medicare HMO | Source: Ambulatory Visit | Attending: Physician Assistant | Admitting: Physician Assistant

## 2021-01-25 VITALS — BP 97/65 | HR 53 | Temp 98.1°F | Resp 20 | Ht 70.0 in | Wt 168.1 lb

## 2021-01-25 DIAGNOSIS — I714 Abdominal aortic aneurysm, without rupture, unspecified: Secondary | ICD-10-CM

## 2021-01-25 LAB — LIPID PANEL
Chol/HDL Ratio: 3.7 ratio (ref 0.0–5.0)
Cholesterol, Total: 145 mg/dL (ref 100–199)
HDL: 39 mg/dL — ABNORMAL LOW (ref >39)
LDL Chol Calc (NIH): 89 mg/dL (ref 0–99)
Triglycerides: 91 mg/dL (ref 0–149)
VLDL Cholesterol Cal: 17 mg/dL (ref 5–40)

## 2021-01-25 LAB — HEPATIC FUNCTION PANEL
ALT: 11 IU/L (ref 0–44)
AST: 13 IU/L (ref 0–40)
Albumin: 4 g/dL (ref 3.6–4.6)
Alkaline Phosphatase: 69 IU/L (ref 44–121)
Bilirubin Total: 0.4 mg/dL (ref 0.0–1.2)
Bilirubin, Direct: 0.11 mg/dL (ref 0.00–0.40)
Total Protein: 6.2 g/dL (ref 6.0–8.5)

## 2021-01-25 NOTE — Progress Notes (Signed)
Office Note     CC:  follow up Requesting Provider:  Coy Saunas, MD  HPI: Dillon Wagner is a 80 y.o. (Oct 10, 1941) male who presents for routine follow up of AAA. He is s/p EVAR on 04/17/16 by Dr. Trula Slade . He denies any abdominal pain or back pain. He has been recently having some right hip discomfort and has seen Orthopedics. He had Injection back in November which only helped minimally. He otherwise denies any lower extremity claudication symptoms, rest pain or tissue loss. He remains fairly active.   His blood pressure is well controlled on current medical management. He is working with PCP on better glucose control. Reports last A1C around 8. Recent lipid panel by Dr. Gwenlyn Found showed elevated cholesterol. Was off of Statin medications for a trial period due to myalgias. Presently back on Crestor but still having associated muscle weakness. Hoping to get on Injectable soon.  He otherwise reports not changes in medical history since his last visit  The pt is on a statin for cholesterol management.  The pt is on a daily aspirin.   Other AC: none The pt is on CCB, BBfor hypertension.   The pt is diabetic.   Tobacco hx: Former smoker  Past Medical History:  Diagnosis Date  . AAA (abdominal aortic aneurysm) (HCC)    4.9 x 4.6 cm  . Arthritis   . Cancer ([place])    skin ca  removed  . Diabetes ([place])    type 2  . Double vision    right eye  . History of stress test 05/04/2012   negative stress test  . HOH (hard of hearing)    both ears  . Hyperlipidemia   . Hypertension   . Hypothyroidism   . Left inguinal hernia   . S/P wrist surgery 09/11/1958   right wrist remove    Past Surgical History:  Procedure Laterality Date  . ABDOMINAL AORTIC ENDOVASCULAR STENT GRAFT N/A 04/17/2016   Procedure: ABDOMINAL AORTIC ENDOVASCULAR STENT GRAFT;  Surgeon: Serafina Mitchell, MD;  Location: [facility];  Service: Vascular;  Laterality: N/A;  . [place]  . BACK SURGERY  2004   lower  .  ELBOW SURGERY Left 1957   Pins  . EYE SURGERY Bilateral    ioc for cataracts  . 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 INGUINAL HERNIA REPAIR Left 10/27/2017   Procedure: OPEN REPAIR OF LEFT INGUINAL HERNA WITH MESH;  Surgeon: Greer Pickerel, MD;  Location: [facility];  Service: General;  Laterality: Left;  . INSERTION OF MESH Left 10/27/2017   Procedure: INSERTION OF MESH;  Surgeon: Greer Pickerel, MD;  Location: [facility];  Service: General;  Laterality: Left;  . NECK SURGERY  05/2003   Dr Consuello Masse  . NM MYOCAR PERF WALL MOTION  05/04/2012   protocol:Lexiscan, lung to heart ratio 38%, post EF 67%, small fixed apical defect  . SHOULDER SURGERY Left 10/31/2012   shoulder replaced   last surgery total of 4 surgeries  . stomach sugery  1969   Infected fat on left side  . thumb surgery Right 01/1995    Social History   Socioeconomic History  . Marital status: Married    Spouse name: Not on file  . Number of children: Not on file  . Years of education: Not on file  . Highest education level: Not on file  Occupational History  . Not on file  Tobacco Use  .  Smoking status: Former Smoker    Packs/day: 2.00    Years: 28.00    Pack years: 56.00    Types: Cigarettes    Quit date: 04/28/1986    Years since quitting: 34.7  . Smokeless tobacco: Former Systems developer    Quit date: 03/22/1987  Vaping Use  . Vaping Use: Never used  Substance and Sexual Activity  . Alcohol use: No    Alcohol/week: 0.0 standard drinks  . Drug use: No  . Sexual activity: Not on file  Other Topics Concern  . Not on file  Social History Narrative  . Not on file   Social Determinants of Health   Financial Resource Strain: Not on file  Food Insecurity: Not on file  Transportation Needs: Not on file  Physical Activity: Not on file  Stress: Not on file  Social Connections: Not on file  Intimate Partner Violence: Not on file   Family History  Problem Relation Age of Onset  . Cancer Mother 15        ovarian  . Cancer Father 55       cancer in the stomach    Current Outpatient Medications  Medication Sig Dispense Refill  . acetaminophen (TYLENOL) 500 MG tablet Take 1,000 mg by mouth every 8 (eight) hours as needed for mild pain.    Marland Kitchen amoxicillin (AMOXIL) 500 MG capsule TAKE 4 CAPSULES BY MOUTH 1 HOUR PRIOR TO APPOINTMENT    . Artificial Tear Solution (GENTEAL TEARS OP) Apply 1 drop to eye 2 (two) times daily as needed (dry eyes).    Marland Kitchen aspirin 81 MG tablet Take 81 mg by mouth daily.    . diclofenac Sodium (VOLTAREN) 1 % GEL Apply topically.    Marland Kitchen diltiazem (DILACOR XR) 240 MG 24 hr capsule Take 240 mg by mouth daily.    . empagliflozin (JARDIANCE) 25 MG TABS tablet Take by mouth.    . gabapentin (NEURONTIN) 600 MG tablet Take 600 mg by mouth 2 (two) times daily.    Marland Kitchen glipiZIDE (GLUCOTROL) 5 MG tablet Take 10 mg by mouth 2 (two) times daily before a meal. Verified takes 1.5 tablets 2 times daily    . levothyroxine (SYNTHROID, LEVOTHROID) 50 MCG tablet TAKE 1 TABLET BY MOUTH EVERY DAY AT 6AM    . loratadine (CLARITIN) 10 MG tablet Take 10 mg by mouth daily.    . metFORMIN (GLUCOPHAGE) 1000 MG tablet Take 500 mg by mouth in the morning and at bedtime.    . metoprolol succinate (TOPROL-XL) 100 MG 24 hr tablet Take 100 mg by mouth daily with supper. Take with or immediately following a meal.    . naproxen (NAPROSYN) 500 MG tablet TAKE 1/2 TO 1 TABLET BY MOUTH 2 TIMES A DAY AS NEEDED FOR LOW BACK AND JOINT PAIN    . nystatin (MYCOSTATIN/NYSTOP) powder Apply to affected area 3 times daily    . pantoprazole (PROTONIX) 40 MG tablet TAKE 1 TABLET (40 MG TOTAL) BY MOUTH TWO (2) TIMES A DAY.    . rosuvastatin (CRESTOR) 20 MG tablet Take by mouth.    . tamsulosin (FLOMAX) 0.4 MG CAPS Take 0.4 mg by mouth daily after breakfast.    . isosorbide mononitrate (IMDUR) 30 MG 24 hr tablet Take 1 tablet (30 mg total) by mouth daily. 90 tablet 3  . nitroGLYCERIN (NITROSTAT) 0.4 MG SL tablet Place 1 tablet (0.4  mg total) under the tongue every 5 (five) minutes as needed for chest pain. 25 tablet 3  No current facility-administered medications for this visit.    Allergies  Allergen Reactions  . Morphine And Related Other (See Comments)    Altered mental status (did not recognize spouse)  . Buprenorphine Hcl     Other reaction(s): Other (See Comments) Altered mental status (did not recognize spouse)  . Codeine Nausea And Vomiting     REVIEW OF SYSTEMS:  [X]  denotes positive finding, [ ]  denotes negative finding Cardiac  Comments:  Chest pain or chest pressure:    Shortness of breath upon exertion:    Short of breath when lying flat:    Irregular heart rhythm:        Vascular    Pain in calf, thigh, or hip brought on by ambulation:    Pain in feet at night that wakes you up from your sleep:     Blood clot in your veins:    Leg swelling:         Pulmonary    Oxygen at home:    Productive cough:     Wheezing:         Neurologic    Sudden weakness in arms or legs:     Sudden numbness in arms or legs:     Sudden onset of difficulty speaking or slurred speech:    Temporary loss of vision in one eye:     Problems with dizziness:         Gastrointestinal    Blood in stool:     Vomited blood:         Genitourinary    Burning when urinating:     Blood in urine:        Psychiatric    Major depression:         Hematologic    Bleeding problems:    Problems with blood clotting too easily:        Skin    Rashes or ulcers:        Constitutional    Fever or chills:      PHYSICAL EXAMINATION:  Vitals:   01/25/21 0913  BP: 97/65  Pulse: (!) 53  Resp: 20  Temp: 98.1 F (36.7 C)  TempSrc: Temporal  SpO2: 98%  Weight: 168 lb 1.6 oz (76.2 kg)  Height: 5\' 10"  (1.778 m)    General:  WDWN in NAD; vital signs documented above Gait: Normal HENT: WNL, normocephalic Pulmonary: normal non-labored breathing , without  wheezing Cardiac: regular HR, without  Murmurs without  carotid bruit Abdomen: soft, NT, no masses. Normal bowel sounds Vascular Exam/Pulses:  Right Left  Radial Amputation 2+ (normal)  Femoral 2+ (normal) 2+ (normal)  Popliteal 2+ (normal) 2+ (normal)  DP 2+ (normal) 2+ (normal)  PT 1+ (weak) 1+ (weak)   Extremities: without ischemic changes, without Gangrene , without cellulitis; without open wounds;  Musculoskeletal: no muscle wasting or atrophy  Neurologic: A&O X 3;  No focal weakness or paresthesias are detected Psychiatric:  The pt has Normal affect.   Non-Invasive Vascular Imaging:   EVAR duplex reviewed and shows patent aneurysm repair with no evidence of endoleak. Maximum diameter is 3.54 cm   ASSESSMENT/PLAN:: 80 y.o. male here for follow up of AAA s/p  EVAR repair 04/17/16 by Dr. Trula Slade. Patient is doing well. He is without any abdominal or back pain. Duplex today shows stable EVAR repair with no endoleak and aneurysm is essentially unchanged in size from prior duplex in 2020 - Reviewed signs and symptoms of sudden severe abdominal  or back pain and should this occur he needs to seek immediate medical care -He will keep his 2 year follow up with EVAR duplex   Karoline Caldwell, PA-C [facility] [phone]  Clinic MD:  Dr. Donzetta Matters

## 2021-07-19 ENCOUNTER — Other Ambulatory Visit: Payer: Self-pay

## 2021-07-19 MED ORDER — ISOSORBIDE MONONITRATE ER 30 MG PO TB24: 30.0000 mg | ORAL_TABLET | Freq: Every day | ORAL | 3 refills | Status: DC

## 2021-11-20 ENCOUNTER — Telehealth: Payer: Self-pay

## 2021-11-20 NOTE — Telephone Encounter (Signed)
Hi Dr. Gwenlyn Found,  Dillon Wagner is scheduled for a blepharoplasty and levator aponeurosis on 12/04/2021 and is being asked to hold Aspirin. He has a history of AAA and underwent endoluminal stent grafting in 03/2016. Last abdominal ultrasound in 12/2020 showed patent endovascular repair with no evidence of endoleak. No known CAD. Myoview in 08/2019 showed no evidnece of ischemia. Can you please comment on how long Aspirin can be held for procedure?  Please route response back to P CV DIV PREOP.  Thank you! Ayani Ospina

## 2021-11-20 NOTE — Telephone Encounter (Signed)
   [facility] Pre-operative Risk Assessment    Patient Name: Dillon Wagner  DOB: [birth year] MRN: [id number]    Request for surgical clearance:  What type of surgery is being performed Blepharoplasty and Levator Aponeurosis  When is this surgery scheduled 12/04/21  What type of clearance is required  Both  Are there any medications that need to be held prior to surgery and how long Aspirin  Practice name and name of physician performing surgery  [facility]   What is the office phone number   [phone]   7.   What is the office fax number  [phone]  8.   Anesthesia type  IV sedation   Kathyrn Lass 11/20/2021, 12:54 PM  _________________________________________________________________   (provider comments below)

## 2021-11-25 NOTE — Telephone Encounter (Signed)
Left VM.   Cleared to hold ASA.

## 2021-11-26 NOTE — Telephone Encounter (Signed)
Traci called to check on the status of this clearance and requested it be faxed over to the the fax number listed in clearance.

## 2021-11-26 NOTE — Telephone Encounter (Signed)
   Name: Dillon Wagner  DOB: 21-Nov-1941  MRN: [id number]   Primary Cardiologist: Quay Burow, MD  Chart reviewed as part of pre-operative protocol coverage. Patient was contacted 11/26/2021 in reference to pre-operative risk assessment for pending surgery as outlined below.  Dillon Wagner was last seen on 01/09/21 by Dr. Gwenlyn Found.  Since that day, Dillon Wagner has done well. He can complete 4.0 METS without angina. He had a nonischemic nuclear stress test in 08/2019. Per Dr. Gwenlyn Found, Dillon Wagner to hold ASA prior to procedure 5-7 days and resume after when safe to do so.  Therefore, based on ACC/AHA guidelines, the patient would be at acceptable risk for the planned procedure without further cardiovascular testing.   The patient was advised that if he develops new symptoms prior to surgery to contact our office to arrange for a follow-up visit, and he verbalized understanding.  I will route this recommendation to the requesting party via Epic fax function and remove from pre-op pool. Please call with questions.  [place], PA 11/26/2021, 1:13 PM

## 2022-01-08 ENCOUNTER — Ambulatory Visit: Payer: Medicare HMO | Admitting: Cardiovascular Disease

## 2022-03-18 ENCOUNTER — Other Ambulatory Visit: Payer: Self-pay

## 2022-03-18 ENCOUNTER — Ambulatory Visit: Payer: Medicare HMO | Admitting: Cardiovascular Disease

## 2022-03-18 ENCOUNTER — Encounter: Payer: Self-pay | Admitting: Cardiovascular Disease

## 2022-03-18 VITALS — BP 120/62 | HR 64 | Ht 70.0 in | Wt 174.6 lb

## 2022-03-18 DIAGNOSIS — Z9889 Other specified postprocedural states: Secondary | ICD-10-CM

## 2022-03-18 DIAGNOSIS — R0789 Other chest pain: Secondary | ICD-10-CM | POA: Diagnosis not present

## 2022-03-18 DIAGNOSIS — I10 Essential (primary) hypertension: Secondary | ICD-10-CM | POA: Diagnosis not present

## 2022-03-18 DIAGNOSIS — E785 Hyperlipidemia, unspecified: Secondary | ICD-10-CM | POA: Diagnosis not present

## 2022-03-18 DIAGNOSIS — Z8679 Personal history of other diseases of the circulatory system: Secondary | ICD-10-CM

## 2022-03-18 NOTE — Assessment & Plan Note (Signed)
History of dyslipidemia on statin therapy with lipid profile performed 01/25/2021 revealing total cholesterol 145, LDL of 89 and HDL of 39 followed by his PCP. ?

## 2022-03-18 NOTE — Assessment & Plan Note (Signed)
History of essential hypertension a blood pressure measured today of 120/62.  He is on metoprolol. ?

## 2022-03-18 NOTE — Progress Notes (Signed)
? ? ? ?03/18/2022 ?Dillon Wagner   ?Feb 23, 1941  ?[id number] ? ?Primary Physician Dillon Ganong, MD ?Primary Cardiologist: Dillon Harp MD Dillon Wagner, [place] ? ?HPI:  Dillon Wagner is a 81 y.o.  mildly overweight, married Caucasian male, father of 90, grandfather to 2 grandchildren who I last in the office 01/09/2021. He was referred to me through the courtesy of Dillon Wagner for evaluation of abdominal aortic aneurysm found on CT scanning performed because of abdominal pain June 20, 2011.  ? ?The patient's risk factors are positive for hypertension, hyperlipidemia and type 2 diabetes. He quit smoking back in 1987 and smoked 30 pack-years prior to that. He has no family history of heart disease. He never had a heart attack or stroke and denied any chest pain or shortness of breath. He did retired from [employer] where he worked up until 18 years ago. He had a Myoview stress test performed in our office May 04, 2012, for preoperative clearance before elective left shoulder replacement which was negative. He had this done successfully at [facility] in November. We have been following his abdominal aortic ultrasound which most recently done in March of last year was 4.9 x 4.6 cm, not large enough to warrant revascularization. Abdominal ultrasound performed 01/17/16 revealed a maximum dimension of 4.9 cm. Dillon Wagner at [facility] care follows his cholesterol. Since I saw him back over a year ago he has undergone endoluminal stent grafting for a 5.9 cm abdominal aortic admission by Dillon Wagner  04/17/16. Follow-up CT angiogram performed the following month showed this to be well-placed and widely patent. Otherwise, he remains asymptomatic. ?  ?He saw Dillon Ransom, PA-C in the office 08/18/2019 complaining of some shortness of breath and chest pressure which was somewhat atypical. It was right-sided and also substernal. A 2D echocardiogram performed 08/18/2019 was normal as was a Myoview stress  test. He was empirically begun on low-dose isosorbide which resulted in some improvement in his chest pain. He is also on antireflux medications. ?  ?He was complaining some leg leg pain apparently was put on a statin holiday but there was no follow-up. He saw Dillon Wagner in the advanced lipid clinic 10/15/2020 who noticed that he had significant fatigue and muscle soreness from multiple statins. He mentioned that he thought his symptoms had improved on a statin holiday. Dillon Wagner said that should he fail rosuvastatin he would be agreeable to a PCSK9. ? ?Since I saw him a year ago he continues to do well.  He no longer complains of pain in his leg.  He has reflux type symptoms which improved with antireflux medications.  His last abdominal ultrasound performed 01/25/2021 showed a well-functioning endoluminal stent graft. ? ?Current Meds  ?Medication Sig  ? acetaminophen (TYLENOL) 500 MG tablet Take 1,000 mg by mouth every 8 (eight) hours as needed for mild pain.  ? amoxicillin (AMOXIL) 500 MG capsule TAKE 4 CAPSULES BY MOUTH 1 HOUR PRIOR TO APPOINTMENT  ? Artificial Tear Solution (GENTEAL TEARS OP) Apply 1 drop to eye 2 (two) times daily as needed (dry eyes).  ? aspirin 81 MG tablet Take 81 mg by mouth daily.  ? diclofenac Sodium (VOLTAREN) 1 % GEL Apply topically.  ? diltiazem (DILACOR XR) 240 MG 24 hr capsule Take 240 mg by mouth daily.  ? empagliflozin (JARDIANCE) 25 MG TABS tablet Take by mouth.  ? gabapentin (NEURONTIN) 600 MG tablet Take 600 mg by mouth 2 (two) times daily.  ?  glipiZIDE (GLUCOTROL) 5 MG tablet Take 10 mg by mouth 2 (two) times daily before a meal. Verified takes 1.5 tablets 2 times daily  ? isosorbide mononitrate (IMDUR) 30 MG 24 hr tablet Take 1 tablet (30 mg total) by mouth daily.  ? levothyroxine (SYNTHROID, LEVOTHROID) 50 MCG tablet TAKE 1 TABLET BY MOUTH EVERY DAY AT 6AM  ? metFORMIN (GLUCOPHAGE) 1000 MG tablet Take 500 mg by mouth in the morning and at bedtime.  ? metoprolol succinate  (TOPROL-XL) 100 MG 24 hr tablet Take 100 mg by mouth daily with supper. Take with or immediately following a meal.  ? naproxen (NAPROSYN) 500 MG tablet TAKE 1/2 TO 1 TABLET BY MOUTH 2 TIMES A DAY AS NEEDED FOR LOW BACK AND JOINT PAIN  ? pantoprazole (PROTONIX) 40 MG tablet TAKE 1 TABLET (40 MG TOTAL) BY MOUTH TWO (2) TIMES A DAY.  ? rosuvastatin (CRESTOR) 20 MG tablet Take by mouth.  ? tamsulosin (FLOMAX) 0.4 MG CAPS Take 0.4 mg by mouth daily after breakfast.  ?  ? ?Allergies  ?Allergen Reactions  ? Morphine And Related Other (See Comments)  ?  Altered mental status (did not recognize spouse)  ? Buprenorphine Hcl   ?  Other reaction(s): Other (See Comments) ?Altered mental status (did not recognize spouse)  ? Codeine Nausea And Vomiting  ? ? ?Social History  ? ?Socioeconomic History  ? Marital status: Married  ?  Spouse name: Not on file  ? Number of children: Not on file  ? Years of education: Not on file  ? Highest education level: Not on file  ?Occupational History  ? Not on file  ?Tobacco Use  ? Smoking status: Former  ?  Packs/day: 2.00  ?  Years: 28.00  ?  Pack years: 56.00  ?  Types: Cigarettes  ?  Quit date: 04/28/1986  ?  Years since quitting: 35.9  ? Smokeless tobacco: Former  ?  Quit date: 03/22/1987  ?Vaping Use  ? Vaping Use: Never used  ?Substance and Sexual Activity  ? Alcohol use: No  ?  Alcohol/week: 0.0 standard drinks  ? Drug use: No  ? Sexual activity: Not on file  ?Other Topics Concern  ? Not on file  ?Social History Narrative  ? Not on file  ? ?Social Determinants of Health  ? ?Financial Resource Strain: Not on file  ?Food Insecurity: Not on file  ?Transportation Needs: Not on file  ?Physical Activity: Not on file  ?Stress: Not on file  ?Social Connections: Not on file  ?Intimate Partner Violence: Not on file  ?  ? ?Review of Systems: ?General: negative for chills, fever, night sweats or weight changes.  ?Cardiovascular: negative for chest pain, dyspnea on exertion, edema, orthopnea,  palpitations, paroxysmal nocturnal dyspnea or shortness of breath ?Dermatological: negative for rash ?Respiratory: negative for cough or wheezing ?Urologic: negative for hematuria ?Abdominal: negative for nausea, vomiting, diarrhea, bright red blood per rectum, melena, or hematemesis ?Neurologic: negative for visual changes, syncope, or dizziness ?All other systems reviewed and are otherwise negative except as noted above. ? ? ? ?Blood pressure 120/62, pulse 64, height '5\' 10"'$  (1.778 m), weight 174 lb 9.6 oz (79.2 kg), SpO2 98 %.  ?General appearance: alert and no distress ?Neck: no adenopathy, no carotid bruit, no JVD, supple, symmetrical, trachea midline, and thyroid not enlarged, symmetric, no tenderness/mass/nodules ?Lungs: clear to auscultation bilaterally ?Heart: regular rate and rhythm, S1, S2 normal, no murmur, click, rub or gallop ?Extremities: extremities normal, atraumatic, no cyanosis or  edema ?Pulses: 2+ and symmetric ?Skin: Skin color, texture, turgor normal. No rashes or lesions ?Neurologic: Grossly normal ? ?EKG sinus rhythm at 64 without ST or T wave changes.  I personally reviewed this EKG. ? ?ASSESSMENT AND PLAN:  ? ?Dyslipidemia ?History of dyslipidemia on statin therapy with lipid profile performed 01/25/2021 revealing total cholesterol 145, LDL of 89 and HDL of 39 followed by his PCP. ? ?Essential hypertension ?History of essential hypertension a blood pressure measured today of 120/62.  He is on metoprolol. ? ?S/P AAA repair ?History of abdominal aortic aneurysm status post endoluminal stent grafting by Dillon Wagner 4/20//20/17 which she follows by abdominal ultrasound last performed 01/25/2021. ? ?Chest pressure ?History of atypical chest pain with a negative Myoview 09/09/2019.  The symptoms are improved with antireflux medications.  He is on a long-acting nitrate as well. ? ? ? ? ?Dillon Harp MD FACP,FACC,FAHA, FSCAI ?03/18/2022 ?10:47 AM ?

## 2022-03-18 NOTE — Assessment & Plan Note (Signed)
History of abdominal aortic aneurysm status post endoluminal stent grafting by Dr. Trula Slade 4/20//20/17 which she follows by abdominal ultrasound last performed 01/25/2021. ?

## 2022-03-18 NOTE — Patient Instructions (Signed)

## 2022-03-18 NOTE — Assessment & Plan Note (Signed)
History of atypical chest pain with a negative Myoview 09/09/2019.  The symptoms are improved with antireflux medications.  He is on a long-acting nitrate as well. ?

## 2022-04-28 ENCOUNTER — Other Ambulatory Visit: Payer: Self-pay | Admitting: Cardiovascular Disease

## 2023-03-23 ENCOUNTER — Other Ambulatory Visit: Payer: Self-pay | Admitting: *Deleted

## 2023-03-23 DIAGNOSIS — Z8679 Personal history of other diseases of the circulatory system: Secondary | ICD-10-CM

## 2023-04-06 ENCOUNTER — Ambulatory Visit: Payer: Medicare HMO | Admitting: Physician Assistant

## 2023-04-06 ENCOUNTER — Ambulatory Visit (HOSPITAL_COMMUNITY)
Admission: RE | Admit: 2023-04-06 | Discharge: 2023-04-06 | Disposition: A | Discharge status: Home / Self Care | Payer: Medicare HMO | Source: Ambulatory Visit | Attending: Surgery | Admitting: Surgery

## 2023-04-06 VITALS — BP 130/76 | HR 62 | Temp 97.3°F | Resp 16 | Ht 70.0 in | Wt 165.0 lb

## 2023-04-06 DIAGNOSIS — Z9889 Other specified postprocedural states: Secondary | ICD-10-CM

## 2023-04-06 DIAGNOSIS — Z8679 Personal history of other diseases of the circulatory system: Secondary | ICD-10-CM

## 2023-04-06 NOTE — Progress Notes (Signed)
[facility] [place] HISTORY AND PHYSICAL   History of Present Illness:  Patient is a 82 y.o. year old male who presents for evaluation of abdominal aortic aneurysm.  He is s/p EVAR on 04/17/16 by Dr. Myra GianottiBrabham . He denies any abdominal pain or back pain.  He denies claudication, rest pain or non healing wounds.    He is doing well and has not had nay recent medical changes.   The pt is on a statin for cholesterol management.  The pt is on a daily aspirin.   Other AC: none The pt is on CCB, BBfor hypertension.   The pt is diabetic.   Tobacco hx: Former smoker   Past Medical History:  Diagnosis Date   AAA (abdominal aortic aneurysm)    4.9 x 4.6 cm   Arthritis    Cancer    skin ca  removed   Diabetes    type 2   Double vision    right eye   History of stress test 05/04/2012   negative stress test   HOH (hard of hearing)    both ears   Hyperlipidemia    Hypertension    Hypothyroidism    Left inguinal hernia    S/P wrist surgery 09/11/1958   right wrist remove    Past Surgical History:  Procedure Laterality Date   ABDOMINAL AORTIC ENDOVASCULAR STENT GRAFT N/A 04/17/2016   Procedure: ABDOMINAL AORTIC ENDOVASCULAR STENT GRAFT;  Surgeon: Nada LibmanVance W Brabham, MD;  Location: [facility] OR;  Service: Vascular;  Laterality: N/A;   APPENDECTOMY  1969   BACK SURGERY  2004   lower   ELBOW SURGERY Left 1957   Pins   EYE SURGERY Bilateral    ioc for cataracts   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   INGUINAL HERNIA REPAIR Left 10/27/2017   Procedure: OPEN REPAIR OF LEFT INGUINAL HERNA WITH MESH;  Surgeon: Gaynelle AduWilson, Eric, MD;  Location: [facility];  Service: General;  Laterality: Left;   INSERTION OF MESH Left 10/27/2017   Procedure: INSERTION OF MESH;  Surgeon: Gaynelle AduWilson, Eric, MD;  Location: [facility];  Service: General;  Laterality: Left;   NECK SURGERY  05/2003   Dr Blanche EastHirsh   NM MYOCAR PERF WALL MOTION  05/04/2012   protocol:Lexiscan, lung to heart ratio 38%, post  EF 67%, small fixed apical defect   SHOULDER SURGERY Left 10/31/2012   shoulder replaced   last surgery total of 4 surgeries   stomach sugery  1969   Infected fat on left side   thumb surgery Right 01/1995     Social History Social History   Tobacco Use   Smoking status: Former    Packs/day: 2.00    Years: 28.00    Additional pack years: 0.00    Total pack years: 56.00    Types: Cigarettes    Quit date: 04/28/1986    Years since quitting: 36.9   Smokeless tobacco: Former    Quit date: 03/22/1987  Vaping Use   Vaping Use: Never used  Substance Use Topics   Alcohol use: No    Alcohol/week: 0.0 standard drinks of alcohol   Drug use: No    Family History Family History  Problem Relation Age of Onset   Cancer Mother 7990       ovarian   Cancer Father 4167       cancer in the stomach    Allergies  Allergies  Allergen Reactions   Morphine  And Related Other (See Comments)    Altered mental status (did not recognize spouse)   Buprenorphine Hcl     Other reaction(s): Other (See Comments) Altered mental status (did not recognize spouse)   Codeine Nausea And Vomiting     Current Outpatient Medications  Medication Sig Dispense Refill   Artificial Tear Solution (GENTEAL TEARS OP) Apply 1 drop to eye 2 (two) times daily as needed (dry eyes).     aspirin 81 MG tablet Take 81 mg by mouth daily.     diclofenac Sodium (VOLTAREN) 1 % GEL Apply topically.     diltiazem (DILACOR XR) 240 MG 24 hr capsule Take 240 mg by mouth daily.     gabapentin (NEURONTIN) 600 MG tablet Take 600 mg by mouth 2 (two) times daily.     glipiZIDE (GLUCOTROL) 5 MG tablet Take 10 mg by mouth 2 (two) times daily before a meal. Verified takes 1.5 tablets 2 times daily     isosorbide mononitrate (IMDUR) 30 MG 24 hr tablet TAKE 1 TABLET BY MOUTH EVERY DAY 90 tablet 3   levothyroxine (SYNTHROID, LEVOTHROID) 50 MCG tablet TAKE 1 TABLET BY MOUTH EVERY DAY AT 6AM     metFORMIN (GLUCOPHAGE) 1000 MG tablet Take 500  mg by mouth in the morning and at bedtime.     metoprolol succinate (TOPROL-XL) 100 MG 24 hr tablet Take 100 mg by mouth daily with supper. Take with or immediately following a meal.     pantoprazole (PROTONIX) 40 MG tablet TAKE 1 TABLET (40 MG TOTAL) BY MOUTH TWO (2) TIMES A DAY.     Semaglutide,0.25 or 0.5MG /DOS, (OZEMPIC, 0.25 OR 0.5 MG/DOSE,) 2 MG/1.5ML SOPN Inject into the skin.     tamsulosin (FLOMAX) 0.4 MG CAPS Take 0.4 mg by mouth daily after breakfast.     acetaminophen (TYLENOL) 500 MG tablet Take 1,000 mg by mouth every 8 (eight) hours as needed for mild pain. (Patient not taking: Reported on 04/06/2023)     amoxicillin (AMOXIL) 500 MG capsule TAKE 4 CAPSULES BY MOUTH 1 HOUR PRIOR TO APPOINTMENT (Patient not taking: Reported on 04/06/2023)     empagliflozin (JARDIANCE) 25 MG TABS tablet Take by mouth. (Patient not taking: Reported on 04/06/2023)     naproxen (NAPROSYN) 500 MG tablet TAKE 1/2 TO 1 TABLET BY MOUTH 2 TIMES A DAY AS NEEDED FOR LOW BACK AND JOINT PAIN (Patient not taking: Reported on 04/06/2023)     nitroGLYCERIN (NITROSTAT) 0.4 MG SL tablet Place 1 tablet (0.4 mg total) under the tongue every 5 (five) minutes as needed for chest pain. 25 tablet 3   rosuvastatin (CRESTOR) 20 MG tablet Take by mouth. (Patient not taking: Reported on 04/06/2023)     No current facility-administered medications for this visit.    ROS:   General:  No weight loss, Fever, chills  HEENT: No recent headaches, no nasal bleeding, no visual changes, no sore throat  Neurologic: No dizziness, blackouts, seizures. No recent symptoms of stroke or mini- stroke. No recent episodes of slurred speech, or temporary blindness.  Cardiac: No recent episodes of chest pain/pressure, no shortness of breath at rest.  No shortness of breath with exertion.  Denies history of atrial fibrillation or irregular heartbeat  Vascular: No history of rest pain in feet.  No history of claudication.  No history of non-healing ulcer,  No history of DVT   Pulmonary: No home oxygen, no productive cough, no hemoptysis,  No asthma or wheezing  Musculoskeletal:  [ x]  Arthritis, [ ]  Low back pain,  [ x] Joint pain  Hematologic:No history of hypercoagulable state.  No history of easy bleeding.  No history of anemia  Gastrointestinal: No hematochezia or melena,  No gastroesophageal reflux, no trouble swallowing  Urinary: [ ]  chronic Kidney disease, [ ]  on HD - [ ]  MWF or [ ]  TTHS, [ ]  Burning with urination, [ ]  Frequent urination, [ ]  Difficulty urinating;   Skin: No rashes  Psychological: No history of anxiety,  No history of depression   Physical Examination  Vitals:   04/06/23 1011  BP: 130/76  Pulse: 62  Resp: 16  Temp: (!) 97.3 F (36.3 C)  TempSrc: Temporal  SpO2: 99%  Weight: 165 lb (74.8 kg)  Height: 5\' 10"  (1.778 m)    Body mass index is 23.68 kg/m.  General:  Alert and oriented, no acute distress HEENT: Normal Neck: No bruit or JVD Pulmonary: Clear to auscultation bilaterally Cardiac: Regular Rate and Rhythm without murmur Gastrointestinal: Soft, non-tender, non-distended, no mass, no scars Skin: No rash Extremity Pulses:  2+ radial,  femoral, dorsalis pedis, posterior tibial pulses bilaterally Musculoskeletal: No deformity or edema  Neurologic: Upper and lower extremity motor 5/5 and symmetric (right hand amputation)  DATA:  Endovascular Aortic Repair (EVAR):  +----------+----------------+-------------------+-------------------+           Diameter AP (cm)Diameter Trans (cm)Velocities (cm/sec)  +----------+----------------+-------------------+-------------------+  Aorta    3.33            3.14               42                   +----------+----------------+-------------------+-------------------+  Right Limb1.16            1.26               42                   +----------+----------------+-------------------+-------------------+  Left Limb 1.31            1.45                64                   +----------+----------------+-------------------+-------------------+     Summary:  Abdominal Aorta: Patent endovascular aneurysm repair with no evidence of  endoleak. See above for comparison.   ASSESSMENT/PLAN:  82 y.o. male here for follow up of AAA s/p  EVAR repair 04/17/16 by Dr. Myra Gianotti. Patient is doing well. He is without any abdominal or back pain. Duplex today shows stable EVAR repair with no endoleak and aneurysm is essentially unchanged in size from prior duplex in 2020.  If there are concerns for abdominal pain or lumbar pain out of the ordinary, claudication, rest pain or non healing wounds he will call our office.  F/U will be arranged in 2 years with repeat duplex.       Mosetta Pigeon PA-C [facility] [place] Office: [phone]  MD in clinic [place]

## 2023-05-04 ENCOUNTER — Other Ambulatory Visit: Payer: Self-pay | Admitting: Cardiovascular Disease

## 2023-05-11 NOTE — Progress Notes (Unsigned)
Cardiology Clinic Note   Date: 05/13/2023 ID: Dillon Wagner, DOB 30-Dec-1940, MRN [id number]  Primary Cardiologist:  Nanetta Batty, MD  Patient Profile    Dillon Wagner is a 82 y.o. male who presents to the clinic today for 1 year follow up.   Past medical history significant for: Chest pressure. Nuclear stress test 09/09/2019: Abnormal, low risk stress test with small prior basal septal infarct versus thinning.  No ischemia. Echo 09/09/2019: EF 55 to 60%.  No RWMA.  Normal RV function.  Mild dilatation of ascending aorta 40 mm. Hypertension. Hyperlipidemia. Lipid panel 10/15/2022: LDL 140, HDL 34, TG 115, total 197. AAA. Endoluminal stent grafting 04/17/2016. EVAR duplex 04/06/2023: Patent endovascular aneurysm repair with no evidence of endoleak. GERD. T2DM.   History of Present Illness    Dillon Wagner is a longtime patient of cardiology.  He is followed by Dr. Allyson Sabal for the above outlined history.  Patient was last seen in the office by Dr. Allyson Sabal on 03/18/2022 for routine follow-up.  He was doing well at that time and no medication changes were made.  Today, patient is doing well. Patient denies shortness of breath or dyspnea on exertion. No chest pain, pressure, or tightness. Denies lower extremity edema, orthopnea, or PND. No palpitations. Patient reports occasional epigastric discomfort that is brief lasting seconds to minutes and resolving on its own. This is the same sensation he experiences when he eats spicy food or sometimes when he is laying in bed and feels acid wash up his throat. He has experienced this for years and it is unchanged. He is very active in the warm months walking and working in the [place] all day. He does not do much when it is cold outside.      ROS: All other systems reviewed and are otherwise negative except as noted in History of Present Illness.  Studies Reviewed    ECG personally reviewed by me today: NSR, 64 bpm.  No significant changes from  03/18/2022.      Physical Exam    VS:  BP 124/76   Pulse 64   Ht 5\' 9"  (1.753 m)   Wt 166 lb 6.4 oz (75.5 kg)   SpO2 98%   BMI 24.57 kg/m  , BMI Body mass index is 24.57 kg/m.  GEN: Well nourished, well developed, in no acute distress. Neck: No JVD or carotid bruits. Cardiac:  RRR. No murmurs. No rubs or gallops.   Respiratory:  Respirations regular and unlabored. Clear to auscultation without rales, wheezing or rhonchi. GI: Soft, nontender, nondistended. Extremities: Radials/DP/PT 2+ and equal bilaterally. No clubbing or cyanosis. No edema.  Skin: Warm and dry, no rash. Neuro: Strength intact.  Assessment & Plan    Hypertension. BP today 124/76.  Patient denies headaches, dizziness or vision changes. Continue diltiazem, isosorbide, metoprolol. GERD.  Abnormal, low risk nuclear stress test September 2020.  Echo September 2020 showed normal LV/RV function.  Patient denies chest pain, pressure or tightness. He does have occasional brief episodes of epigastric discomfort that lasts seconds to minutes. This discomfort is unchanged for years. Many of the episodes occur with spicy foods. Continue isosorbide and pantoprazole. Hyperlipidemia. LDL October 2023 140. Continue rosuvastatin. Followed by PCP.  AAA.  S/p endoluminal stent grafting April 2017.  EVAR duplex April 2024 showed patent endovascular aneurysm repair with no evidence of endoleak.  Followed by vascular surgery.  Disposition: Follow-up in one year or sooner as needed.  Signed, Etta Grandchild. Nysa Sarin, DNP, NP-C

## 2023-05-13 ENCOUNTER — Ambulatory Visit: Payer: Medicare HMO | Attending: Student | Admitting: Student

## 2023-05-13 ENCOUNTER — Encounter: Payer: Self-pay | Admitting: Student

## 2023-05-13 VITALS — BP 124/76 | HR 64 | Ht 69.0 in | Wt 166.4 lb

## 2023-05-13 DIAGNOSIS — K219 Gastro-esophageal reflux disease without esophagitis: Secondary | ICD-10-CM

## 2023-05-13 DIAGNOSIS — Z9889 Other specified postprocedural states: Secondary | ICD-10-CM | POA: Diagnosis not present

## 2023-05-13 DIAGNOSIS — E782 Mixed hyperlipidemia: Secondary | ICD-10-CM | POA: Diagnosis not present

## 2023-05-13 DIAGNOSIS — Z8679 Personal history of other diseases of the circulatory system: Secondary | ICD-10-CM

## 2023-05-13 DIAGNOSIS — I10 Essential (primary) hypertension: Secondary | ICD-10-CM

## 2023-05-13 NOTE — Patient Instructions (Signed)
Medication Instructions:  Your physician recommends that you continue on your current medications as directed. Please refer to the Current Medication list given to you today.  *If you need a refill on your cardiac medications before your next appointment, please call your pharmacy*   Lab Work: NONE If you have labs (blood work) drawn today and your tests are completely normal, you will receive your results only by: MyChart Message (if you have MyChart) OR A paper copy in the mail If you have any lab test that is abnormal or we need to change your treatment, we will call you to review the results.   Testing/Procedures: NONE   Follow-Up: At [facility], you and your health needs are our priority.  As part of our continuing mission to provide you with exceptional heart care, we have created designated Provider Care Teams.  These Care Teams include your primary Cardiologist (physician) and Advanced Practice Providers (APPs -  Physician Assistants and Nurse Practitioners) who all work together to provide you with the care you need, when you need it.  We recommend signing up for the patient portal called "MyChart".  Sign up information is provided on this After Visit Summary.  MyChart is used to connect with patients for Virtual Visits (Telemedicine).  Patients are able to view lab/test results, encounter notes, upcoming appointments, etc.  Non-urgent messages can be sent to your provider as well.   To learn more about what you can do with MyChart, go to [email or web address].    Your next appointment:   1 year(s)  Provider:   Jonathan Berry, MD    

## 2023-07-28 ENCOUNTER — Other Ambulatory Visit: Payer: Self-pay | Admitting: Cardiovascular Disease

## 2024-04-11 ENCOUNTER — Encounter: Payer: Self-pay | Admitting: Neurology

## 2024-05-10 ENCOUNTER — Encounter: Payer: Self-pay | Admitting: Cardiovascular Disease

## 2024-05-10 ENCOUNTER — Ambulatory Visit: Attending: Cardiovascular Disease | Admitting: Cardiovascular Disease

## 2024-05-10 VITALS — BP 92/52 | HR 47 | Ht 70.0 in | Wt 173.8 lb

## 2024-05-10 DIAGNOSIS — E785 Hyperlipidemia, unspecified: Secondary | ICD-10-CM | POA: Diagnosis not present

## 2024-05-10 DIAGNOSIS — I10 Essential (primary) hypertension: Secondary | ICD-10-CM

## 2024-05-10 DIAGNOSIS — Z9889 Other specified postprocedural states: Secondary | ICD-10-CM | POA: Diagnosis not present

## 2024-05-10 DIAGNOSIS — Z8679 Personal history of other diseases of the circulatory system: Secondary | ICD-10-CM

## 2024-05-10 NOTE — Progress Notes (Signed)
 05/10/2024 Dillon Wagner   May 13, 1941  [id number]  Primary Physician Alfrieda Antes, MD Primary Cardiologist: Avanell Leigh MD FACP, [place], [place], [place]  HPI:  Dillon Wagner is a 83 y.o.  mildly overweight, married Caucasian male, father of 3, grandfather to 2 grandchildren who I last in the office 03/18/2022. He was referred to me through the courtesy of Dr. Zada Herrlich for evaluation of abdominal aortic aneurysm found on CT scanning performed because of abdominal pain June 20, 2011.   The patient's risk factors are positive for hypertension, hyperlipidemia and type 2 diabetes. He quit smoking back in 1987 and smoked 30 pack-years prior to that. He has no family history of heart disease. He never had a heart attack or stroke and denied any chest pain or shortness of breath. He did retired from [employer] where he worked up until 18 years ago. He had a Myoview  stress test performed in our office May 04, 2012, for preoperative clearance before elective left shoulder replacement which was negative. He had this done successfully at [facility] in November. We have been following his abdominal aortic ultrasound which most recently done in March of last year was 4.9 x 4.6 cm, not large enough to warrant revascularization. Abdominal ultrasound performed 01/17/16 revealed a maximum dimension of 4.9 cm. Dr. Broderick Canning at [facility] primary care follows his cholesterol. Since I saw him back over a year ago he has undergone endoluminal stent grafting for a 5.9 cm abdominal aortic admission by Dr. Charlotte Cookey  04/17/16. Follow-up CT angiogram performed the following month showed this to be well-placed and widely patent. Otherwise, he remains asymptomatic.   He saw Humphrey Magnuson, PA-C in the office 08/18/2019 complaining of some shortness of breath and chest pressure which was somewhat atypical. It was right-sided and also substernal. A 2D echocardiogram performed 08/18/2019 was normal as was a Myoview  stress  test. He was empirically begun on low-dose isosorbide  which resulted in some improvement in his chest pain. He is also on antireflux medications.   He was complaining some leg leg pain apparently was put on a statin holiday but there was no follow-up. He saw Dr. Maximo Spar in the advanced lipid clinic 10/15/2020 who noticed that he had significant fatigue and muscle soreness from multiple statins. He mentioned that he thought his symptoms had improved on a statin holiday. Dr. Maximo Spar said that should he fail rosuvastatin  he would be agreeable to a PCSK9.  Since I saw him in the 2 years ago he continues to do well.  He denies chest pain or shortness of breath.  He did have 2 TIAs in March of this year on the 13th and 24th.  He wore a Zio patch that showed no evidence of A-fib.   Current Meds  Medication Sig   acetaminophen  (TYLENOL ) 500 MG tablet Take 1,000 mg by mouth every 8 (eight) hours as needed for mild pain (pain score 1-3).   amoxicillin (AMOXIL) 500 MG capsule    Artificial Tear Solution (GENTEAL TEARS OP) Apply 1 drop to eye 2 (two) times daily as needed (dry eyes).   aspirin  81 MG tablet Take 81 mg by mouth daily.   diclofenac Sodium (VOLTAREN) 1 % GEL Apply topically.   diltiazem  (DILACOR XR ) 240 MG 24 hr capsule Take 240 mg by mouth daily.   empagliflozin (JARDIANCE) 25 MG TABS tablet Take by mouth.   ezetimibe (ZETIA) 10 MG tablet Take 1 tablet (10 mg total) by mouth daily.  gabapentin  (NEURONTIN ) 600 MG tablet Take 600 mg by mouth 2 (two) times daily.   glipiZIDE  (GLUCOTROL ) 5 MG tablet Take 10 mg by mouth 2 (two) times daily before a meal. Verified takes 1.5 tablets 2 times daily   insulin  degludec (TRESIBA) 100 UNIT/ML FlexTouch Pen Inject 7 Units into the skin daily.   isosorbide  mononitrate (IMDUR ) 30 MG 24 hr tablet TAKE 1 TABLET (30 MG TOTAL) BY MOUTH DAILY. KEEP OV.   levothyroxine  (SYNTHROID , LEVOTHROID) 50 MCG tablet TAKE 1 TABLET BY MOUTH EVERY DAY AT 6AM   metFORMIN   (GLUCOPHAGE ) 1000 MG tablet Take 500 mg by mouth in the morning and at bedtime.   metoprolol  succinate (TOPROL -XL) 100 MG 24 hr tablet Take 100 mg by mouth daily with supper. Take with or immediately following a meal.   naproxen (NAPROSYN) 500 MG tablet    pantoprazole  (PROTONIX ) 40 MG tablet TAKE 1 TABLET (40 MG TOTAL) BY MOUTH TWO (2) TIMES A DAY.   tamsulosin  (FLOMAX ) 0.4 MG CAPS Take 0.4 mg by mouth daily after breakfast.   [DISCONTINUED] rosuvastatin  (CRESTOR ) 20 MG tablet Take by mouth.     Allergies  Allergen Reactions   Morphine  And Codeine Other (See Comments)    Altered mental status (did not recognize spouse)   Buprenorphine Hcl     Other reaction(s): Other (See Comments) Altered mental status (did not recognize spouse)   Codeine Nausea And Vomiting    Social History   Socioeconomic History   Marital status: Married    Spouse name: Not on file   Number of children: Not on file   Years of education: Not on file   Highest education level: Not on file  Occupational History   Not on file  Tobacco Use   Smoking status: Former    Current packs/day: 0.00    Average packs/day: 2.0 packs/day for 28.0 years (56.0 ttl pk-yrs)    Types: Cigarettes    Start date: 04/28/1958    Quit date: 04/28/1986    Years since quitting: 38.0   Smokeless tobacco: Former    Quit date: 03/22/1987  Vaping Use   Vaping status: Never Used  Substance and Sexual Activity   Alcohol use: No    Alcohol/week: 0.0 standard drinks of alcohol   Drug use: No   Sexual activity: Not on file  Other Topics Concern   Not on file  Social History Narrative   Not on file   Social Drivers of Health   Financial Resource Strain: Low Risk  (10/16/2022)   Received from [facility]   Overall Financial Resource Strain (CARDIA)    Difficulty of Paying Living Expenses: Not hard at all  Food Insecurity: No Food Insecurity (01/26/2024)   Received from [facility]   Hunger Vital Sign     Worried About Running Out of Food in the Last Year: Never true    Ran Out of Food in the Last Year: Never true  Transportation Needs: No Transportation Needs (01/26/2024)   Received from [facility]   PRAPARE - Transportation    Lack of Transportation (Medical): No    Lack of Transportation (Non-Medical): No  Physical Activity: Sufficiently Active (10/16/2022)   Received from [facility]   Exercise Vital Sign    Days of Exercise per Week: 3 days    Minutes of Exercise per Session: 90 min  Stress: No Stress Concern Present (10/16/2022)   Received from [facility]   [employer] of Occupational Health - Occupational Stress Questionnaire    Feeling of Stress : Not at all  Social Connections: Socially Integrated (10/16/2022)   Received from [facility], [facility]   Social Connection and Isolation Panel [NHANES]    Frequency of Communication with Friends and Family: Once a week    Frequency of Social Gatherings with Friends and Family: More than three times a week    Attends Religious Services: More than 4 times per year    Active Member of [employer] or Organizations: Yes    Attends Engineer, structural: More than 4 times per year    Marital Status: Married  Catering manager Violence: Not At Risk (10/16/2022)   Received from [facility], [facility]   Humiliation, Afraid, Rape, and Kick questionnaire    Fear of Current or Ex-Partner: No    Emotionally Abused: No    Physically Abused: No    Sexually Abused: No     Review of Systems: General: negative for chills, fever, night sweats or weight changes.  Cardiovascular: negative for chest pain, dyspnea on exertion, edema, orthopnea, palpitations, paroxysmal nocturnal dyspnea or shortness of breath Dermatological: negative for rash Respiratory: negative for cough or wheezing Urologic: negative for hematuria Abdominal: negative for nausea, vomiting, diarrhea, bright  red blood per rectum, melena, or hematemesis Neurologic: negative for visual changes, syncope, or dizziness All other systems reviewed and are otherwise negative except as noted above.    Blood pressure (!) 92/52, pulse (!) 47, height 5\' 10"  (1.778 m), weight 173 lb 12.8 oz (78.8 kg), SpO2 96%.  General appearance: alert and no distress Neck: no adenopathy, no carotid bruit, no JVD, supple, symmetrical, trachea midline, and thyroid not enlarged, symmetric, no tenderness/mass/nodules Lungs: clear to auscultation bilaterally Heart: regular rate and rhythm, S1, S2 normal, no murmur, click, rub or gallop Extremities: extremities normal, atraumatic, no cyanosis or edema Pulses: 2+ and symmetric Skin: Skin color, texture, turgor normal. No rashes or lesions Neurologic: Grossly normal  EKG EKG Interpretation Date/Time:  Tuesday May 10 2024 13:34:36 EDT Ventricular Rate:  47 PR Interval:  178 QRS Duration:  84 QT Interval:  442 QTC Calculation: 391 R Axis:   61  Text Interpretation: Sinus bradycardia Nonspecific T wave abnormality When compared with ECG of 13-Oct-2003 13:05, Vent. rate has decreased BY  32 BPM Nonspecific T wave abnormality now evident in Inferior leads Confirmed by Lauro Portal [phone]) on 05/10/2024 1:38:59 PM    ASSESSMENT AND PLAN:   Dyslipidemia History of dyslipidemia intolerant to statin therapy currently on Zetia with lipid profile performed 6 months ago revealing total cholesterol of 170, LDL 110 and HDL of 34.  Essential hypertension History of essential hypertension blood pressure measured today at 92/52.  He is on diltiazem  and metoprolol .  S/P AAA repair History of endoluminal stent graft repair by Dr. Charlotte Cookey at my request/20/17 which he follows in his office by duplex ultrasound.     Avanell Leigh MD FACP,FACC,FAHA, [facility] 05/10/2024 1:49 PM

## 2024-05-10 NOTE — Assessment & Plan Note (Signed)
 History of endoluminal stent graft repair by Dr. Charlotte Cookey at my request/20/17 which he follows in his office by duplex ultrasound.

## 2024-05-10 NOTE — Assessment & Plan Note (Signed)
 History of dyslipidemia intolerant to statin therapy currently on Zetia with lipid profile performed 6 months ago revealing total cholesterol of 170, LDL 110 and HDL of 34.

## 2024-05-10 NOTE — Patient Instructions (Signed)
 Medication Instructions:  Your physician recommends that you continue on your current medications as directed. Please refer to the Current Medication list given to you today.    *If you need a refill on your cardiac medications before your next appointment, please call your pharmacy*   Lab Work: None    If you have labs (blood work) drawn today and your tests are completely normal, you will receive your results only by: MyChart Message (if you have MyChart) OR A paper copy in the mail If you have any lab test that is abnormal or we need to change your treatment, we will call you to review the results.   Testing/Procedures: None    Follow-Up: At [facility], you and your health needs are our priority.  As part of our continuing mission to provide you with exceptional heart care, we have created designated Provider Care Teams.  These Care Teams include your primary Cardiologist (physician) and Advanced Practice Providers (APPs -  Physician Assistants and Nurse Practitioners) who all work together to provide you with the care you need, when you need it.  We recommend signing up for the patient portal called "MyChart".  Sign up information is provided on this After Visit Summary.  MyChart is used to connect with patients for Virtual Visits (Telemedicine).  Patients are able to view lab/test results, encounter notes, upcoming appointments, etc.  Non-urgent messages can be sent to your provider as well.   To learn more about what you can do with MyChart, go to [email or web address].    Your next appointment:   Call or return to clinic as needed if these symptoms worsen or fail to improve as anticipated.    Provider:   Lauro Portal, MD   Other Instructions

## 2024-05-10 NOTE — Assessment & Plan Note (Signed)
 History of essential hypertension blood pressure measured today at 92/52.  He is on diltiazem  and metoprolol .

## 2024-05-17 NOTE — Progress Notes (Unsigned)
 Initial neurology clinic note  Dillon Wagner MRN: [id number] DOB: [birth year]  Referring provider: Alfrieda Antes, MD  Primary care provider: Alfrieda Antes, MD  Reason for consult:  word finding difficulty  Subjective:  This is [name], a 83 y.o. right-handed male with a medical history of hypothroidism, HTN, HLD, DM, AAA s/p repair, OA, lumbar spine disease who presents to neurology clinic with transient word finding difficulty. The patient is accompanied by wife.  On 03/10/24 patient was talking to wife about lunch. He could not say the word "salad". All his other words were normal. It lasted < 1 minute. Then he was totally normal. He had no other symptoms. On 03/21/24 patient was again trying to talk to his wife. He wanted to tell her to look at a puzzle he had put together, but he could not say the word puzzle. This again lasted < 1 minute. The first time, he didn't think too much of it. The second time worried patient more. He called his PCP to discuss and was told to go to ED. He had a CT head that showed a remote right basal ganglia infarct, but no acute process. Patient denies any previous stroke symptoms. 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 He was already on asa 81 mg daily, so plavix 75 mg daily was added for potential TIA. He completed this in mid 03/2024. His LDL was 110. He is on Zetia 10 mg daily as he is intolerant of statins. He wore a ziopatch, which was negative for afib.  He has not had any clear episodes since 03/21/24, but does think he has more difficulty coming up with names. The issue with remembering names is much longer standing though.  Patient takes gabapentin  for diabetic neuropathy. He may have gotten diabetes from steroids during a hospitalization. The gabapentin  works well for him.  He is a former smoker, last in 49.  Of note, patient lost the distal part of his right arm in working accident. He was right handed prior to this.  EtOH use:  none Restrictive diet: no Family history of neurologic disease: sister with stroke (at age of 23s)  MEDICATIONS:  Outpatient Encounter Medications as of 05/25/2024  Medication Sig Note   Artificial Tear Solution (GENTEAL TEARS OP) Apply 1 drop to eye 2 (two) times daily as needed (dry eyes).    aspirin  81 MG tablet Take 81 mg by mouth daily.    diclofenac Sodium (VOLTAREN) 1 % GEL Apply topically.    diltiazem  (DILACOR XR ) 240 MG 24 hr capsule Take 240 mg by mouth daily.    ezetimibe (ZETIA) 10 MG tablet Take 1 tablet (10 mg total) by mouth daily.    gabapentin  (NEURONTIN ) 600 MG tablet Take 600 mg by mouth in the morning and at bedtime.    glipiZIDE  (GLUCOTROL ) 5 MG tablet Take 10 mg by mouth 2 (two) times daily before a meal. Verified takes 1.5 tablets 2 times daily    insulin  degludec (TRESIBA) 100 UNIT/ML FlexTouch Pen Inject 7 Units into the skin daily.    isosorbide  mononitrate (IMDUR ) 30 MG 24 hr tablet TAKE 1 TABLET (30 MG TOTAL) BY MOUTH DAILY. KEEP OV.    levothyroxine  (SYNTHROID , LEVOTHROID) 50 MCG tablet Take 88 mcg by mouth. 88 mcg on Saturday and Sunday and 75 mcg on Monday thur Friday    metFORMIN  (GLUCOPHAGE ) 1000 MG tablet Take 500 mg by mouth in the morning and at bedtime.    metoprolol   succinate (TOPROL -XL) 100 MG 24 hr tablet Take 100 mg by mouth daily with supper. Take with or immediately following a meal.    naproxen (NAPROSYN) 500 MG tablet as needed.    nitroGLYCERIN  (NITROSTAT ) 0.4 MG SL tablet Place 1 tablet (0.4 mg total) under the tongue every 5 (five) minutes as needed for chest pain. 01/09/2021: Has on hand    pantoprazole  (PROTONIX ) 40 MG tablet TAKE 1 TABLET (40 MG TOTAL) BY MOUTH TWO (2) TIMES A DAY.    tamsulosin  (FLOMAX ) 0.4 MG CAPS Take 0.4 mg by mouth daily after breakfast.    acetaminophen  (TYLENOL ) 500 MG tablet Take 1,000 mg by mouth every 8 (eight) hours as needed for mild pain (pain score 1-3). (Patient not taking: Reported on 05/25/2024)    amoxicillin  (AMOXIL) 500 MG capsule  (Patient not taking: Reported on 05/25/2024)    empagliflozin (JARDIANCE) 25 MG TABS tablet Take by mouth. (Patient not taking: Reported on 05/25/2024)    Semaglutide,0.25 or 0.5MG /DOS, (OZEMPIC, 0.25 OR 0.5 MG/DOSE,) 2 MG/1.5ML SOPN Inject into the skin. (Patient not taking: Reported on 05/25/2024)    No facility-administered encounter medications on file as of 05/25/2024.    PAST MEDICAL HISTORY: Past Medical History:  Diagnosis Date   AAA (abdominal aortic aneurysm) (HCC)    4.9 x 4.6 cm   Arthritis    Cancer (HCC)    skin ca  removed   Diabetes (HCC)    type 2   Double vision    right eye   History of stress test 05/04/2012   negative stress test   HOH (hard of hearing)    both ears   Hyperlipidemia    Hypertension    Hypothyroidism    Left inguinal hernia    S/P wrist surgery 09/11/1958   right wrist remove    PAST SURGICAL HISTORY: Past Surgical History:  Procedure Laterality Date   ABDOMINAL AORTIC ENDOVASCULAR STENT GRAFT N/A 04/17/2016   Procedure: ABDOMINAL AORTIC ENDOVASCULAR STENT GRAFT;  Surgeon: Dillon Shell, MD;  Location: [facility] OR;  Service: Vascular;  Laterality: N/A;   APPENDECTOMY  1969   BACK SURGERY  2004   lower   ELBOW SURGERY Left 1957   Pins   EYE SURGERY Bilateral    ioc for cataracts   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   INGUINAL HERNIA REPAIR Left 10/27/2017   Procedure: OPEN REPAIR OF LEFT INGUINAL HERNA WITH MESH;  Surgeon: Dillon Hummingbird, MD;  Location: [facility] ORS;  Service: General;  Laterality: Left;   INSERTION OF MESH Left 10/27/2017   Procedure: INSERTION OF MESH;  Surgeon: Dillon Hummingbird, MD;  Location: [facility] ORS;  Service: General;  Laterality: Left;   NECK SURGERY  05/2003   Dr Dillon Wagner   NM MYOCAR PERF WALL MOTION  05/04/2012   protocol:Lexiscan , lung to heart ratio 38%, post EF 67%, small fixed apical defect   SHOULDER SURGERY Left 10/31/2012   shoulder replaced   last surgery total of 4 surgeries    stomach sugery  1969   Infected fat on left side   thumb surgery Right 01/1995    ALLERGIES: Allergies  Allergen Reactions   Morphine  And Codeine Other (See Comments)    Altered mental status (did not recognize spouse)   Buprenorphine Hcl     Other reaction(s): Other (See Comments) Altered mental status (did not recognize spouse)   Codeine Nausea And Vomiting    FAMILY HISTORY: Family History  Problem Relation  Age of Onset   Cancer Mother 72       ovarian   Cancer Father 104       cancer in the stomach    SOCIAL HISTORY: Social History   Tobacco Use   Smoking status: Former    Current packs/day: 0.00    Average packs/day: 2.0 packs/day for 28.0 years (56.0 ttl pk-yrs)    Types: Cigarettes    Start date: 04/28/1958    Quit date: 04/28/1986    Years since quitting: 38.1   Smokeless tobacco: Former    Quit date: 03/22/1987  Vaping Use   Vaping status: Never Used  Substance Use Topics   Alcohol use: No    Alcohol/week: 0.0 standard drinks of alcohol   Drug use: No   Social History   Social History Narrative   Are you right handed or left handed? Left   Are you currently employed ?    What is your current occupation? retired   Do you live at home alone?   Who lives with you? wife   What type of home do you live in: 1 story or 2 story? one    Caffiene  dranks 1/2 and 1/2 caf 8 cups a day    Objective:  Vital Signs:  BP (!) 156/66   Pulse (!) 59   Ht 5\' 10"  (1.778 m)   Wt 179 lb (81.2 kg)   SpO2 98%   BMI 25.68 kg/m   General: General appearance: Awake and alert. No distress. Cooperative with exam.  Skin: No obvious rash or jaundice. HEENT: Atraumatic. Anicteric. Lungs: Non-labored breathing on room air  Heart: Regular Abdomen: Soft, non tender. Extremities: No edema. Missing distal right upper extremity. Psych: Affect appropriate.  Neurological: Language: no obvious aphasia. Able to name and repeat. Mental Status: Alert. Speech fluent. No  pseudobulbar affect Cranial Nerves: CNII: No RAPD. Visual fields intact. CNIII, IV, VI: PERRL. No nystagmus. EOMI. CN V: Facial sensation intact bilaterally to fine touch. CN VII: Facial muscles symmetric and strong. No ptosis at rest. CN VIII: Hears finger rub well bilaterally. Wears hearing aids. CN IX: No hypophonia. CN X: Palate elevates symmetrically. CN XI: Full strength shoulder shrug bilaterally. CN XII: Tongue protrusion full and midline. No atrophy or fasciculations. No significant dysarthria Motor: Tone is normal.   Individual muscle group testing (MRC grade out of 5):  Movement     Neck flexion 5    Neck extension 5     Right Left   Shoulder abduction 5 5   Elbow flexion 5 5   Elbow extension 5 5   Finger abduction - FDI - 5   Finger abduction - ADM - 5   Finger extension - 5   Finger distal flexion - 2/3 - 5   Finger distal flexion - 4/5 - 5   Thumb flexion - FPL - 5    Hip flexion 5 5   Knee extension 5 5   Knee flexion 5 5   Dorsiflexion 5 5   Plantarflexion 5 5     Reflexes:  Right Left   Bicep 2+ 2+   Tricep 2+ 2+   BrRad - 2+   Knee 2+ 2+   Ankle 0 0    Sensation: Pinprick: Intact in all extremities Vibration: Diminished in bilateral great toes and ankles, otherwise intact Coordination: Intact finger-to- nose-finger bilaterally. Romberg with mild sway. Gait: Able to rise from chair with arms crossed unassisted. Narrow based gait.  Labs and Imaging review: External labs: 04/25/24: TSH wnl HbA1c: 8.2  03/22/24: CMP significant for glucose 149 CRP wnl ESR wnl  Lipid panel (10/21/23): tChol 170, LDL 110, TG 132  Imaging/Procedures: External echocardiogram (04/06/24):   External CT head wo contrast (03/22/24): FINDINGS:  No midline shift or mass effect. Global cerebral volume loss with ex vacuo prominence of the ventricles. No findings to suggest acute large territory infarct. Remote right basal ganglia lacunar infarct. No sizable  extra-axial fluid collections. No evidence of acute intracranial hemorrhage. No fracture. Mastoid air cells are clear. Visualized paranasal sinuses are pneumatized. Bilateral lens replacement.   IMPRESSION:  No acute intracranial abnormality.   External CTA neck (03/22/24): FINDINGS:  Assessment is limited by metallic artifact from left shoulder arthroplasty and cervical ACDF.   No evidence of dissection or aneurysm in the carotid or vertebral arteries. No hemodynamically significant arterial stenosis. Irregular noncalcified atheromatous plaque of the aortic arch.   The basilar artery originates from the left vertebral artery and the distal right vertebral artery terminates at the level of PICA, anatomic variant. Direct origin of the left vertebral artery from the aortic arch, anatomic variant.   The soft tissues are unremarkable without lymphadenopathy, mass or abscess. Postsurgical changes of C5-C7 ACDF. Multilevel degenerative disc disease and facet arthropathy throughout the cervical spine. The airway is patent and midline. The lung apices are unremarkable.   In this report, if stenosis of the internal carotid artery is reported, it is calculated based on the NASCET method: (1 - (minimal residual diameter/normal diameter of distal ICA)).   IMPRESSION:  No acute arterial abnormality within the neck.   External CTA head (03/22/24): FINDINGS: Diffuse cerebral atrophy with compensatory ventricular enlargement. No intracranial hemorrhage, acute infarct or mass. The osseous structures are unremarkable.  Short segment of moderate-severe stenosis of the right PCA P2 segment (13:36). Otherwise the major arterial structures are normal in appearance. No large vessel occlusion. Wall irregularity and mild focal dilatation of the left ophthalmic segment ICA (9:95).  IMPRESSION: -- Short segment of moderate to severe stenosis of the right P2 segment PCA. Atherosclerotic contour irregularity and mild  focal dilatation of the left ophthalmic segment ICA. No large vessel occlusion.    Assessment/Plan:  Dillon Wagner is a 83 y.o. male who presents for evaluation of transient episodes of aphasia x2. He has a relevant medical history of hypothroidism, HTN, HLD, DM, AAA s/p repair, OA, lumbar spine disease. His neurological examination is pertinent for diminished vibratory sensation in the lower limbs in a length dependent fashion. Available diagnostic data is significant for CT head showing no acute process but remote right basal ganglia stroke. CTA head and neck showed severe stenosis of right PCA. Labs were significant for HbA1c 8.2, LDL 110. The etiology of patient's symptoms is unclear, but the most concerning possibility is TIA. This and his remote stroke would likely be due to small vessel disease with known risk factors of HTN, HLD, and DM. His HLD is currently not well controlled (LDL 110), as the goal in post-stroke/TIA patients is < 70. Patient is on Zetia but unable to take statins. He may benefit from PSK9 inhibitor therapy.   His lower extremity sensory loss is likely a combination of known lumbar spine disease and diabetic neuropathy.  PLAN: -Continue aspirin  81 mg daily -Continue Zetia 10 mg daily for now. Patient to discuss HLD with PCP as LDL goal would be < 70 after TIA or stroke. There is room for improvement there,  but patient is intolerant of statins. -Discussed importance of HTN, DM, and HLD control -Stroke warning signs discussed -Continue gabapentin  for suspected diabetic neuropathy  -Return to clinic in 6 months  The impression above as well as the plan as outlined below were extensively discussed with the patient (in the company of wife) who voiced understanding. All questions were answered to their satisfaction.  The patient was counseled on pertinent fall precautions per the printed material provided today, and as noted under the "Patient Instructions" section  below.  When available, results of the above investigations and possible further recommendations will be communicated to the patient via telephone/MyChart. Patient to call office if not contacted after expected testing turnaround time.   Total time spent reviewing records, interview, history/exam, documentation, and coordination of care on day of encounter:  65 min   Thank you for allowing me to participate in patient's care.  If I can answer any additional questions, I would be pleased to do so.  Rommie Coats, MD   CC: Dillon Antes, MD [address] [place] [address] [place] [address]  CC: Referring provider: Alfrieda Antes, MD [address],  [place] [address]

## 2024-05-25 ENCOUNTER — Ambulatory Visit: Admitting: Neurology

## 2024-05-25 ENCOUNTER — Encounter: Payer: Self-pay | Admitting: Neurology

## 2024-05-25 VITALS — BP 156/66 | HR 59 | Ht 70.0 in | Wt 179.0 lb

## 2024-05-25 DIAGNOSIS — R4701 Aphasia: Secondary | ICD-10-CM | POA: Diagnosis not present

## 2024-05-25 DIAGNOSIS — G459 Transient cerebral ischemic attack, unspecified: Secondary | ICD-10-CM

## 2024-05-25 DIAGNOSIS — E785 Hyperlipidemia, unspecified: Secondary | ICD-10-CM

## 2024-05-25 DIAGNOSIS — I6381 Other cerebral infarction due to occlusion or stenosis of small artery: Secondary | ICD-10-CM

## 2024-05-25 DIAGNOSIS — Z794 Long term (current) use of insulin: Secondary | ICD-10-CM

## 2024-05-25 DIAGNOSIS — E1142 Type 2 diabetes mellitus with diabetic polyneuropathy: Secondary | ICD-10-CM

## 2024-05-25 DIAGNOSIS — R29818 Other symptoms and signs involving the nervous system: Secondary | ICD-10-CM | POA: Diagnosis not present

## 2024-05-25 NOTE — Patient Instructions (Signed)
 I saw you today for the episodes of not being able to come up with words. This could be related to poor blood flow to the brain called a transient ischemic attack or TIA. Your CT scan did not show evidence of a new stroke, but did show an old small stroke, so we know you are at risk of more.  To help reduce your risk of another stroke: -Continue aspirin  81 mg daily -Continue Zetia 10 mg daily for now. Please discuss your cholesterol with your primary care doctor as your bad cholesterol number was higher than we would like after stroke or TIA. I'm not sure if you have other options for cholesterol treatment though. -Controlling high blood pressure and diabetes is also very important  If you have new difficulty speaking, face droop, numbness on one side of the body, weakness on one side of the body, or dizziness/imbalance, this could be the sign of a stroke. Don't wait, please call EMS and be evaluated at the nearest emergency room.  -Return to clinic in 6 months  Please let me know if you have any questions or concerns in the meantime.   The physicians and staff at [facility] are committed to providing excellent care. You may receive a survey requesting feedback about your experience at our office. We strive to receive "very good" responses to the survey questions. If you feel that your experience would prevent you from giving the office a "very good " response, please contact our office to try to remedy the situation. We may be reached at [phone]. Thank you for taking the time out of your busy day to complete the survey.  Rommie Coats, MD [facility]

## 2024-07-25 ENCOUNTER — Other Ambulatory Visit: Payer: Self-pay | Admitting: Cardiovascular Disease

## 2024-12-07 ENCOUNTER — Ambulatory Visit: Admitting: Neurology
